# Patient Record
Sex: Female | Born: 1965 | Race: White | Hispanic: No | Smoking: Current every day smoker
Health system: Southern US, Community
[De-identification: ages and names within clinical notes are randomized; demographics above are authoritative.]

## PROBLEM LIST (undated history)

## (undated) DIAGNOSIS — I10 Essential (primary) hypertension: Secondary | ICD-10-CM

## (undated) DIAGNOSIS — J45909 Unspecified asthma, uncomplicated: Secondary | ICD-10-CM

## (undated) DIAGNOSIS — G629 Polyneuropathy, unspecified: Secondary | ICD-10-CM

## (undated) DIAGNOSIS — J189 Pneumonia, unspecified organism: Secondary | ICD-10-CM

## (undated) DIAGNOSIS — K219 Gastro-esophageal reflux disease without esophagitis: Secondary | ICD-10-CM

## (undated) DIAGNOSIS — F419 Anxiety disorder, unspecified: Secondary | ICD-10-CM

## (undated) DIAGNOSIS — J449 Chronic obstructive pulmonary disease, unspecified: Secondary | ICD-10-CM

## (undated) HISTORY — DX: Essential (primary) hypertension: I10

## (undated) HISTORY — DX: Chronic obstructive pulmonary disease, unspecified: J44.9

## (undated) HISTORY — DX: Pneumonia, unspecified organism: J18.9

## (undated) HISTORY — PX: ENDOMETRIAL ABLATION: SHX621

## (undated) HISTORY — PX: TUBAL LIGATION: SHX77

---

## 1998-11-22 ENCOUNTER — Encounter: Admission: RE | Admit: 1998-11-22 | Discharge: 1999-02-20 | Payer: Self-pay | Admitting: Unknown Physician Specialty

## 1999-05-26 ENCOUNTER — Ambulatory Visit (HOSPITAL_COMMUNITY): Admission: RE | Admit: 1999-05-26 | Discharge: 1999-05-26 | Payer: Self-pay | Admitting: Neurosurgery

## 1999-05-26 ENCOUNTER — Encounter: Payer: Self-pay | Admitting: Neurosurgery

## 1999-06-13 ENCOUNTER — Encounter: Admission: RE | Admit: 1999-06-13 | Discharge: 1999-09-11 | Payer: Self-pay | Admitting: Neurosurgery

## 1999-06-17 ENCOUNTER — Encounter: Admission: RE | Admit: 1999-06-17 | Discharge: 1999-07-25 | Payer: Self-pay | Admitting: Neurosurgery

## 1999-06-24 ENCOUNTER — Encounter: Payer: Self-pay | Admitting: Neurosurgery

## 1999-06-24 ENCOUNTER — Ambulatory Visit (HOSPITAL_COMMUNITY): Admission: RE | Admit: 1999-06-24 | Discharge: 1999-06-24 | Payer: Self-pay | Admitting: Neurosurgery

## 1999-07-08 ENCOUNTER — Encounter: Payer: Self-pay | Admitting: Neurosurgery

## 1999-07-08 ENCOUNTER — Ambulatory Visit (HOSPITAL_COMMUNITY): Admission: RE | Admit: 1999-07-08 | Discharge: 1999-07-08 | Payer: Self-pay | Admitting: Neurosurgery

## 1999-07-22 ENCOUNTER — Ambulatory Visit (HOSPITAL_COMMUNITY): Admission: RE | Admit: 1999-07-22 | Discharge: 1999-07-22 | Payer: Self-pay | Admitting: Neurosurgery

## 1999-07-22 ENCOUNTER — Encounter: Payer: Self-pay | Admitting: Neurosurgery

## 1999-08-25 HISTORY — PX: BACK SURGERY: SHX140

## 1999-10-31 ENCOUNTER — Encounter: Payer: Self-pay | Admitting: Neurosurgery

## 1999-11-04 ENCOUNTER — Encounter: Payer: Self-pay | Admitting: Neurosurgery

## 1999-11-04 ENCOUNTER — Inpatient Hospital Stay (HOSPITAL_COMMUNITY): Admission: RE | Admit: 1999-11-04 | Discharge: 1999-11-08 | Payer: Self-pay | Admitting: Neurosurgery

## 1999-11-19 ENCOUNTER — Encounter: Payer: Self-pay | Admitting: Neurosurgery

## 1999-11-19 ENCOUNTER — Encounter: Admission: RE | Admit: 1999-11-19 | Discharge: 1999-11-19 | Payer: Self-pay | Admitting: Neurosurgery

## 1999-12-05 ENCOUNTER — Encounter: Payer: Self-pay | Admitting: Emergency Medicine

## 1999-12-05 ENCOUNTER — Emergency Department (HOSPITAL_COMMUNITY): Admission: EM | Admit: 1999-12-05 | Discharge: 1999-12-05 | Payer: Self-pay | Admitting: Emergency Medicine

## 2000-02-02 ENCOUNTER — Encounter: Admission: RE | Admit: 2000-02-02 | Discharge: 2000-02-02 | Payer: Self-pay | Admitting: Neurosurgery

## 2000-02-02 ENCOUNTER — Encounter: Payer: Self-pay | Admitting: Neurosurgery

## 2000-02-07 ENCOUNTER — Emergency Department (HOSPITAL_COMMUNITY): Admission: EM | Admit: 2000-02-07 | Discharge: 2000-02-07 | Payer: Self-pay | Admitting: Emergency Medicine

## 2000-09-09 ENCOUNTER — Encounter: Admission: RE | Admit: 2000-09-09 | Discharge: 2000-09-09 | Payer: Self-pay | Admitting: Neurosurgery

## 2000-09-09 ENCOUNTER — Encounter: Payer: Self-pay | Admitting: Neurosurgery

## 2000-09-28 ENCOUNTER — Encounter: Payer: Self-pay | Admitting: Neurosurgery

## 2000-09-28 ENCOUNTER — Ambulatory Visit (HOSPITAL_COMMUNITY): Admission: RE | Admit: 2000-09-28 | Discharge: 2000-09-28 | Payer: Self-pay | Admitting: Neurosurgery

## 2001-01-07 ENCOUNTER — Encounter: Admission: RE | Admit: 2001-01-07 | Discharge: 2001-04-07 | Payer: Self-pay | Admitting: Anesthesiology

## 2001-01-08 ENCOUNTER — Emergency Department (HOSPITAL_COMMUNITY): Admission: EM | Admit: 2001-01-08 | Discharge: 2001-01-08 | Payer: Self-pay | Admitting: *Deleted

## 2001-01-08 ENCOUNTER — Encounter: Payer: Self-pay | Admitting: Emergency Medicine

## 2001-04-13 ENCOUNTER — Encounter: Admission: RE | Admit: 2001-04-13 | Discharge: 2001-04-23 | Payer: Self-pay | Admitting: Anesthesiology

## 2002-05-01 ENCOUNTER — Encounter: Admission: RE | Admit: 2002-05-01 | Discharge: 2002-05-01 | Payer: Self-pay | Admitting: Orthopedic Surgery

## 2002-06-28 ENCOUNTER — Emergency Department (HOSPITAL_COMMUNITY): Admission: EM | Admit: 2002-06-28 | Discharge: 2002-06-28 | Payer: Self-pay | Admitting: *Deleted

## 2002-06-28 ENCOUNTER — Encounter: Payer: Self-pay | Admitting: *Deleted

## 2002-07-09 ENCOUNTER — Encounter: Payer: Self-pay | Admitting: Anesthesiology

## 2002-07-09 ENCOUNTER — Ambulatory Visit (HOSPITAL_COMMUNITY): Admission: RE | Admit: 2002-07-09 | Discharge: 2002-07-09 | Payer: Self-pay | Admitting: Anesthesiology

## 2002-10-18 ENCOUNTER — Encounter: Admission: RE | Admit: 2002-10-18 | Discharge: 2002-12-06 | Payer: Self-pay | Admitting: Neurosurgery

## 2004-03-04 ENCOUNTER — Emergency Department (HOSPITAL_COMMUNITY): Admission: EM | Admit: 2004-03-04 | Discharge: 2004-03-04 | Payer: Self-pay | Admitting: Emergency Medicine

## 2005-01-21 ENCOUNTER — Emergency Department (HOSPITAL_COMMUNITY): Admission: EM | Admit: 2005-01-21 | Discharge: 2005-01-21 | Payer: Self-pay | Admitting: Emergency Medicine

## 2005-03-05 ENCOUNTER — Ambulatory Visit: Payer: Self-pay | Admitting: Family Medicine

## 2005-04-03 ENCOUNTER — Encounter: Admission: RE | Admit: 2005-04-03 | Discharge: 2005-04-03 | Payer: Self-pay | Admitting: Family Medicine

## 2005-10-05 ENCOUNTER — Ambulatory Visit: Payer: Self-pay | Admitting: Internal Medicine

## 2005-10-08 ENCOUNTER — Ambulatory Visit: Payer: Self-pay | Admitting: Cardiology

## 2005-10-19 ENCOUNTER — Ambulatory Visit: Payer: Self-pay | Admitting: Internal Medicine

## 2006-01-04 ENCOUNTER — Ambulatory Visit: Payer: Self-pay | Admitting: Internal Medicine

## 2006-02-02 ENCOUNTER — Ambulatory Visit: Payer: Self-pay | Admitting: Internal Medicine

## 2006-12-13 ENCOUNTER — Ambulatory Visit: Payer: Self-pay | Admitting: Family Medicine

## 2007-12-27 ENCOUNTER — Ambulatory Visit: Payer: Self-pay | Admitting: Cardiovascular Disease

## 2008-01-06 ENCOUNTER — Ambulatory Visit: Payer: Self-pay

## 2008-01-06 ENCOUNTER — Encounter: Payer: Self-pay | Admitting: Cardiovascular Disease

## 2008-02-03 DIAGNOSIS — J309 Allergic rhinitis, unspecified: Secondary | ICD-10-CM | POA: Insufficient documentation

## 2008-02-03 DIAGNOSIS — J4489 Other specified chronic obstructive pulmonary disease: Secondary | ICD-10-CM | POA: Insufficient documentation

## 2008-02-03 DIAGNOSIS — J449 Chronic obstructive pulmonary disease, unspecified: Secondary | ICD-10-CM

## 2008-02-06 ENCOUNTER — Ambulatory Visit: Payer: Self-pay | Admitting: Internal Medicine

## 2008-03-08 ENCOUNTER — Ambulatory Visit: Payer: Self-pay | Admitting: Internal Medicine

## 2008-04-03 ENCOUNTER — Encounter: Admission: RE | Admit: 2008-04-03 | Discharge: 2008-04-03 | Payer: Self-pay | Admitting: Anesthesiology

## 2008-11-17 DIAGNOSIS — R079 Chest pain, unspecified: Secondary | ICD-10-CM | POA: Insufficient documentation

## 2008-11-17 DIAGNOSIS — E039 Hypothyroidism, unspecified: Secondary | ICD-10-CM | POA: Insufficient documentation

## 2008-11-17 DIAGNOSIS — R0602 Shortness of breath: Secondary | ICD-10-CM

## 2008-11-17 DIAGNOSIS — M549 Dorsalgia, unspecified: Secondary | ICD-10-CM | POA: Insufficient documentation

## 2009-05-30 ENCOUNTER — Encounter (HOSPITAL_COMMUNITY): Payer: Self-pay | Admitting: Obstetrics and Gynecology

## 2009-05-30 ENCOUNTER — Ambulatory Visit (HOSPITAL_COMMUNITY): Admission: RE | Admit: 2009-05-30 | Discharge: 2009-05-30 | Payer: Self-pay | Admitting: Obstetrics and Gynecology

## 2009-08-22 ENCOUNTER — Telehealth (INDEPENDENT_AMBULATORY_CARE_PROVIDER_SITE_OTHER): Payer: Self-pay | Admitting: *Deleted

## 2010-02-26 ENCOUNTER — Encounter: Admission: RE | Admit: 2010-02-26 | Discharge: 2010-02-26 | Payer: Self-pay | Admitting: Anesthesiology

## 2010-09-14 ENCOUNTER — Encounter: Payer: Self-pay | Admitting: Family Medicine

## 2010-09-15 ENCOUNTER — Encounter: Payer: Self-pay | Admitting: Anesthesiology

## 2010-10-07 ENCOUNTER — Ambulatory Visit: Payer: Self-pay | Admitting: Internal Medicine

## 2010-10-08 ENCOUNTER — Telehealth: Payer: Self-pay | Admitting: Internal Medicine

## 2010-10-15 NOTE — Progress Notes (Signed)
Summary: nos appt  Phone Note Call from Patient   Caller: [email or web address]  Call For: wert Summary of Call: LMTCB x2 to rsc nos from 12/14. Initial call taken by: Darletta Moll,  October 08, 2010 3:20 PM

## 2010-11-27 LAB — TYPE AND SCREEN
ABO/RH(D): A POS
Antibody Screen: NEGATIVE

## 2010-11-27 LAB — CBC
HCT: 39 % (ref 36.0–46.0)
Hemoglobin: 13.2 g/dL (ref 12.0–15.0)
MCV: 92.2 fL (ref 78.0–100.0)
Platelets: 288 10*3/uL (ref 150–400)
RBC: 4.23 MIL/uL (ref 3.87–5.11)
WBC: 7.8 10*3/uL (ref 4.0–10.5)

## 2010-11-27 LAB — ABO/RH: ABO/RH(D): A POS

## 2011-01-06 NOTE — Assessment & Plan Note (Signed)
[facility]                            CARDIOLOGY OFFICE NOTE   KATHLEE, BARNHARDT                      MRN:          [id number]  DATE:12/27/2007                            DOB:          1966-06-14    PRIMARY CARE PHYSICIAN:  Delaney Meigs, M.D.   HISTORY:  Ms. Natasha Massey is a 45 year old patient referred for chest pain  and dyspnea.  The patient is unfortunately a smoker, half a pack for  over 25 years.   The patient has had chest pain over the last 6 months.  It has been  worse in the last month.  It is atypical.  It is not necessarily  exertional.  She does describe a squeezing-type pain in her chest, but  also a sharp pain.  It can radiate to her shoulders and down her left  arm.  It is indicated as atypical and not always being exertional.  There is no pleuritic component.  She does have an occasional cough.  She seems to indicate that she gets frequent pneumonias.  I suspect  there is some immunocompromise from her smoking.  The patient has not had a stress test.  Her dyspnea is also somewhat  chronic likely related to her smoking.  She has not had formal PFTs   When the patient gets her pain, there is not anything in particular she  does to make it go away.  She has chronic back problems and is on fairly  strong narcotic medication called Kadian for pain. This does not seem to  interact with her chest pain.   REVIEW OF SYSTEMS:  Otherwise remarkable for question of frequent  pneumonias.  She has had stomach problems with constipation.  Again,  likely related to the narcotics and question of blood in her stool.  There is also a history of thyroid disease with recent elevated TSH on  replacement.   PAST MEDICAL HISTORY:  1. Includes L5 fusion for degenerative disk disease in 2000.  2. Recently diagnosed hypothyroidism.  3. Mild menstrual dysfunction.  4. Smoking with clinical COPD.  5. Constipation.   SOCIAL HISTORY:  The patient is  disabled.  She is divorced.  She has 2  daughters that live near her and her mother helps out.  She watches her  43-year-old granddaughter daily.  She is able to walk despite her back  problems.   ALLERGIES:  1. PENICILLIN.  2. ASPIRIN.  Aspirin causes a significant rash.   CURRENT MEDICATIONS:  1. Kadian 200 a for pain.  2. Levothyroxine 50 mcs a day.   FAMILY HISTORY:  Remarkable for her mother being alive at age 16.  She  does not report anything for her father.   PHYSICAL EXAMINATION:  GENERAL:  Remarkable for a middle-aged white  female in no distress.  VITAL SIGNS:  Blood pressure is 115/75, pulse 82 and regular, weight  173, afebrile.  HEENT:  Unremarkable.  NECK:  Carotids are normal without bruit.  No lymphadenopathy,  thyromegaly JVP elevation.  LUNGS:  Clear with good diaphragmatic motion.  No wheezing.  CARDIAC:  S1-S2  with normal heart sounds.  PMI normal.  ABDOMEN:  Benign.  Bowel sounds are positive.  No AAA.  No tenderness,  no bruit, no hepatosplenomegaly, no hepatojugular reflux.  EXTREMITIES:  Distal pulses are intact, no edema.  No muscular weakness.  NEURO:  Nonfocal.  SKIN:  Warm and dry.   DIAGNOSTICS:  EKG shows low-voltage sinus rhythm, nonspecific ST/T wave  changes.   IMPRESSION:  1. Chest pain atypical in a smoker, abnormal electrocardiogram.      Follow up stress Myoview.  2. Abnormal electrocardiogram with low-voltage.  Check 2-D      echocardiogram to rule out pericardial effusion.  3. Dyspnea likely related to smoking.  Primary care doctors can      consider whether they want to follow up with PFTs.  The patient      needs to stop smoking.  I will leave it up to her primary care      doctors to add Wellbutrin or Chantix.  4. Hypothyroidism.  TSH was only 8 on low-dose levothyroxine, probably      follow up TSH in 6 months.  5. Chronic back pain on to Kadian.  6. Consider addition of stool softeners and laxative in regards to her       constipation.   Further recommendations will be based on the results of her echo and  Myoview.     Noralyn Pick. Eden Emms, MD, [facility]  Electronically Signed    PCN/MedQ  DD: 12/27/2007  DT: 12/27/2007  Job #: [id number]   cc:   Helene Kelp, PA-C

## 2011-01-09 NOTE — Procedures (Signed)
[facility]  Patient:    Natasha Massey, Natasha Massey Visit Number: [id number] MRN: [id number]          Service Type: PMG Location: [facility] Attending Physician:  Thyra Breed Proc. Date: 04/13/01 Adm. Date:  01/07/2001 Disc. Date: 04/07/2001   CC:         [name]. [name], M.D.   Procedure Report  PROCEDURE:  Lumbar epidural steroid injection.  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  INTERVAL HISTORY:  The patients noted at least a 70% reduction in her discomfort after her first lumbar epidural steroid injection. She continues on the same medications as previously which include Duragesic 75 mcg one every three days and Elavil 25 mg two in the evening.  PHYSICAL EXAMINATION:  The patient demonstrates a blood pressure of 87/56, heart rate 105, respiratory rate 16, O2 saturations 99%, pain level is 6/10. Her neuro exam is grossly unchanged from her last visit. She showed good healing from a previous injection site.  DESCRIPTION OF PROCEDURE:  After informed consent was obtained, the patient was placed in the sitting position and monitored. The patients back was prepped with Betadine x 3. A skin wheal was raised at the L3-4 interspace with 1 percent lidocaine using a 25 gauge needle. A 20 gauge Tuohy needle was introduced to the lumbar epidural space to loss of resistance to preservative free normal saline. The depth was 4 cm. A mixture of 80 mg of Medrol with 6 ml of preservative free normal saline was gently injected.  The needle was flushed and removed intact.  CONDITION POST PROCEDURE:  Stable.  DISCHARGE INSTRUCTIONS:  Resume previous diet. Limitations in activities per instruction sheet. Continue on current medications. Follow-up with me in one week for repeat epidural. Attending Physician:  Thyra Breed DD:  04/13/01 TD:  04/13/01 Job: [id number]

## 2011-01-09 NOTE — Procedures (Signed)
[facility]  Patient:    Natasha Massey, Natasha Massey                      MRN: [id number] Proc. Date: 01/19/01 Adm. Date:  [id number] Attending:  Thyra Breed CC:         Colon Flattery, D.O.  [name]. [name], M.D.   Procedure Report  PROCEDURE:  Trigger point injection of her left piriformis.  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  INTERVAL HISTORY:  The patient has noted some improvements with regard to her overall pain and sleeping quality on the Duragesic patch and the Elavil.  She is tolerating these well.  She did feel as though the injection was temporarily helpful and would like another one today.  PHYSICAL EXAMINATION: Blood pressure 89/45.  Heart rate is 83.  Respiratory rate 18.  O2 saturations 98%.  Pain level is 8.5/10.  She exhibits tenderness over her left piriformis muscle, and the area is marked.  Her neuro exam is otherwise unchanged.  DESCRIPTION OF PROCEDURE:  After informed consent was obtained, I prepped out the area of involvement over her left piriformis muscle.  I injected the subcutaneous tissues with a 27 gauge needle to anesthetize the skin with 1% lidocaine.  A 25 gauge spinal needle was introduced down to approximately the piriformis muscle by elicitation of paresthesias and retracting the needle back approximately .5 cm.  Aspiration was negative for blood and CSF.  I injected 3 cc of local anesthetic which consisted of 1% lidocaine mixed with 0.5% levobupivacaine in a 1:1 ratio.  The needle was removed intact.  Condition postprocedure - stable.  DISCHARGE INSTRUCTIONS: 1. Resume previous diet. 2. Limitations and activities per instruction sheet and as outlined by my    assistant today. 3. Follow up with me next week to consider repeat injections. 4. She was planning to go ahead and see Dr. Dewaine Conger for her lymphadenopathy    next week.DD:  01/19/01 TD:  01/19/01 Job:  [id number]

## 2011-01-09 NOTE — Op Note (Signed)
[place]  Patient:    Natasha Massey, Natasha Massey                      MRN: [id number] Proc. Date: 11/04/99 Adm. Date:  [id number] Attending:  Danella Penton                           Operative Report  PREOPERATIVE DIAGNOSIS:  Degenerative disk disease L5-S1 with bilateral foraminal stenosis and radiculopathy.  Chronic back pain.  POSTOPERATIVE DIAGNOSIS:  Degenerative disk disease L5-S1 with bilateral foraminal stenosis and radiculopathy.  Chronic back pain.  OPERATION PERFORMED:  Bilateral L5-S1 diskectomy, facetectomy, foraminotomies.  Insertion of Ray cages 14 x 21.  Autologous bone graft and posterolateral fusion transverse process to sacral with the same autologous bone graft.  Midas Rex.  SURGEON:  [name]. [name], M.D.  ASSISTANT:  Payton Doughty, M.D.  ANESTHESIA:  CLINICAL HISTORY:  Ms. Ruppert is a 45 year old female who had been complaining f back pain with radiation to both legs associated with weakness and dorsiflexion. The patient has failed conservative treatment including epidural injection and medication.  MRI showed a bad case of degenerative disk disease between 5-1. The patient is a heavy smoker.  Because of no improvement, the patient will now have her surgery.  The patient knew of the risks such as poor uptake of bone graft because of the smoking history, infection, CSF leak, worsening of the pain, paralysis and need for further surgery.  DESCRIPTION OF PROCEDURE:  The patient was taken to the operating room and was positioned in a prone manner.  We knew by x-ray that there was no abnormality between flexion and extension of the lumbar spine.  Then the area was prepped with Betadine.  A midline incision from L5 to S1 was made.  Muscle and fascia were retracted laterally all the way behind the facet until we were able to feel the  transverse process of L5 as well as the ala of the sacrum.  Then we  proceeded with the Leksell and Midas Rex and removal of lamina of L5 bilaterally and part of the upper part of S1.  A thick yellow ligament was also excised laterally.  We identified the L5-S1 space by x-ray.  Indeed, there was degenerative disk disease and the disk was soft.  There was some spondylosis.  We did total diskectomy. Having done this, we did decompression of the L5 and S1 nerve root all the way lateral.  Then with the Leksell as well as the Midas Rex we removed the facet of L5-S1.  Then after we did the total gross diskectomy, we inserted the __________ to drill the end plate of X1-G6.  Having done this, we took an x-ray which showed good position of the equipment.  From then on we measured the area and we were ble to insert two 14 x 21 Ray cages.  During dissection, protection of the L5, S1 as well as the thecal sac was done.  Having done this, we did the x-ray which showed good position of both Ray cages.  Then using the bone from the facet of the lamina, we mixed it with Grafton.  The Ray cages were filled up with this mix of Grafton and autologous bone graft.  The plastic caps were used.  Then we went laterally and with the Midas Rex, we drilled the transverse process of L5 and the ala of the  sacrum.  Then, having the same materials, we put the mixture laterally into that area.  In the end, the area was irrigated.  There was no evidence of any CSF leak. Investigation showed good position of the Ray cages as well as plenty of room for the thecal sac of the L5 and S1 nerve root.  Preservation of the spinous process as well as the interspinous ligament was done.  Having done this, a piece of fat was left in the epidural space.  The area was irrigated.  Fentanyl was left in he dural space and the wound was closed with Vicryl and Steri-Strips. DD:  11/04/99 TD:  11/04/99 Job: [id number]

## 2011-01-09 NOTE — Procedures (Signed)
[facility]  Patient:    Natasha Massey, Natasha Massey                      MRN: [id number] Proc. Date: 03/23/01 Adm. Date:  [id number] Attending:  Thyra Breed CC:         Colon Flattery, D.O.  [name]. [name], M.D.   Procedure Report  PROCEDURE:  Piriformis trigger point injection.  DIAGNOSIS:  Piriformis syndrome with history of sciatica and underlying lumbar degenerative disk disease and spondylosis.  INTERVAL HISTORY:  The patient noted that she gets about 5-10 days of marked improvement after her trigger point injections but between these, she is having flare ups of discomfort.  She has tried to do stretching exercises at the pool, and I reviewed with her these exercises to try and increase the length that the trigger point injections will help.  She is tolerating the Duragesic well but is not sleeping well on her current dose of Elavil 25 mg in the evening.  PHYSICAL EXAMINATION: Blood pressure 116/51.  Heart rate is 75.  Respiratory rate 18.  O2 saturations 98%.  Pain level is 8/10.  Deep tendon reflexes are symmetric at the knees and ankles.  She has pain on extension of her right leg completely which is potentiated by internal rotation at the hip.  Sensory and motor exam are unchanged.  DESCRIPTION OF PROCEDURE:  After informed consent was obtained, the patient was placed in the right lateral decubitus position with the right leg extended and her left flexed at the hip and knee.  A line was drawn from the greater trochanter to the posterior superior iliac spine, and at the mid point of this line, a line was drawn perpendicularly to intersect the line from the great trochanter to the sacral hiatus.  Pressure over this area elicited discomfort. The area was marked and prepped with Betadine x 3.  A skin wheal was raised with a 27 gauge needle using 1% lidocaine.  A 25 gauge spinal needle was introduced down to paresthesias in the sciatic nerve  distribution and retracted back approximately 3-5 mm.  Aspiration was negative for blood, and I injected 3 cc of the local anesthetic mixture.  The needle was removed intact.  The local anesthetic mixture consisted of 4 cc of 1% lidocaine mixed with 4 cc of 0.5% levobupivacaine with 20 mg of Medrol.  POSTPROCEDURE CONDITION:  The patient noted good pain relief.  DISPOSITION: 1. Continue on current medications. 2. Limitations and activities per instruction sheet, as outlined by my    assistant today. 3. Continue on Duragesic patch 75 mcg 1 every 2 days #15 with no refill and    Elavil 25 mg 2 p.o. q.p.m. #60 with 3 refills. 4. Follow up with me in two weeks to consider repeat injection of the    piriformis muscle. 5. I reviewed stretching exercises and encouraged her to use a tennis ball and    try and massage her buttock region. DD:  03/23/01 TD:  03/23/01 Job: [id number]

## 2011-01-09 NOTE — Discharge Summary (Signed)
[place]  Patient:    Natasha Massey, Natasha Massey                      MRN: [id number] Adm. Date:  [id number] Disc. Date: [id number] Attending:  Danella Penton                           Discharge Summary  For full details of this admission, please refer to the typed history and physical.  HISTORY OF PRESENT ILLNESS:  The patient is a 45 year old white female who has een complaining of back pain with radiation into both legs associated with weakness and dorsiflexion.  The patient has failed medical management including epidural steroid injections.  MRI demonstrated degenerative disease at L5-S1.  The patient failed medical management and, therefore, weighed the risks, benefits, and alternatives to surgery and decided to proceed with a diskectomy and fusion.  For past medical history, past surgical history, medications prior to admission, drug allergies, family medical history, social history, admission physical examination, imaging studies, assessment plan, etc., please refer to the typed history and physical.  HOSPITAL COURSE:  Dr. [name] admitted the patient on November 04, 1999, with the diagnosis of L5-S1 degenerative disk disease, spinal stenosis, lumbago, lumbar radiculopathy.  On the day of admission he performed a bilateral L5-S1 diskectomy, facetectomy, foraminotomies with posterior lumbar interbody fusion and insertion of titanium interbody cages and local morcellized autograft bone and posterolateral transverse process fusion using autologous bone.  The surgery went well without  complications (for full details of this operation, please refer to the typed operative note).  POSTOPERATIVE COURSE:  The patients postoperative course was as follows:  She remained afebrile, vital signs were stable.  She did have some weakness in her right extensor hallucis longus/dorsiflexors, approximately 3-4/5.  This continued. The patient was seen by  physical therapy and mobilized.  By November 08, 1999, she was afebrile, vital signs were stable.  She was eating well, ambulating well.  Her wound was healing well without signs of infection.  She continued to have right  EHL/dorsiflexor weakness.  She requested discharge home.  She was, therefore, discharged home on November 08, 1999.  DISCHARGE MEDICATIONS: 1. Valium 5 mg #50 1 p.o. q.6h. p.r.n. for muscle spasms, no refills. 2. Percocet 5 #60 1-2 p.o. q.4h. p.r.n. for pain, limit eight per day, no refills.  DISCHARGE INSTRUCTIONS:  The patient was given written discharge instructions.  FOLLOW-UP:  The patient was instructed to follow up with Dr. [name].  FINAL DIAGNOSIS:  L5-S1 degenerative disk disease, herniated nucleus pulposus, spinal stenosis, lumbago, lumbar radiculopathy.  PROCEDURES:  L5-S1 bilateral diskectomy, facetectomy, foraminotomies, insertion of Ray cages, autologous bone graft, posterolateral fusion transverse process to sacrum. DD:  11/08/99 TD:  11/10/99 Job: [id number]

## 2011-01-09 NOTE — H&P (Signed)
[place]  Patient:    Natasha Massey, Natasha Massey                      MRN: [id number] Adm. Date:  [id number] Attending:  Danella Penton                         History and Physical  HISTORY OF PRESENT ILLNESS:  Natasha Massey is a lady who is 45 years old, who was een by me initially on May 08, 1999.  At that time, she came complaining of back pain for almost two years.  According to her, the pain had started in the area f the lumbar spine, down to both hips anteriorly, associated with a tingling sensation most in the lateral aspect of both thighs.  The patient came telling e that she was feeling worse.  Patient was found to have a cyst of the ovary and he had been taken care of by her gynecologist.  Because of the back pain, she was ent to [place] for evaluation.  At the beginning, we did an MRI which showed that she had a cystic lesion at the level of the S1 nerve but there was no displacement of the  thecal sac.  What we found was a bad case of degenerative disc disease with foraminal stenosis at the level of 5-1.  We continued with conservative treatment including an L5-S1 epidural injection and the patient did not improve.  She continued to have more pain down to both legs.  Because of that, she wanted to o ahead with surgery.  PAST MEDICAL HISTORY:  Tubal ligation.  ALLERGIES:  She is allergic to ASPIRIN.  SOCIAL HISTORY:  She smokes a half a pack a day.  She does not drink.  FAMILY HISTORY:  Mother is 90 with emphysema and melanoma.  There is also a history of diabetes, cancer and heart disease in her family.  REVIEW OF SYSTEMS:  She complains of some fatigue, night sweats and now she has  abdominal pain, change in the bowel movements and rest pain.  She is nauseated; she had lost 20 pounds.  PHYSICAL EXAMINATION:  HEENT:  Normal.  NECK:  There are no bruits.  She has good flexibility.  LUNGS:  Clear.  HEART:  Heart  sounds normal.  EXTREMITIES:  There were normal extremities and normal pulses.  NEUROLOGIC:  Mental status normal.  Cranial nerves normal.  Strength is 5/5 in he upper and lower extremities.  Sensation seems to be normal.  She has a decrease in flexibility of the lumbar spine and extension on lateralization produces pain down to both hips.  She has some mild weakness on dorsiflexion and plantarflexion of  both feet.  IMAGING STUDIES:  The MRI showed that indeed she has a bad case of degenerative  disc disease at the level of L5-S1 with bilateral foraminal stenosis.  CLINICAL IMPRESSION:  Degenerative disc disease at L5-S1 with foraminal stenosis.  RECOMMENDATION:  The patient is going to be taken to surgery.  We are going to proceed with bilateral L5-S1 diskectomy, facetectomy and interbody fusion using Ray cages and autologous graft.  We are going to proceed also with a posterolateral  fusion using allograft.  Patient knows of the risks with the surgery such as infection, no uptake of the bone graft because of the history of smoking, need or further surgery, paralysis and no improvement whatsoever in her back pain.  Patient declines a second opinion. DD:  11/04/99 TD:  11/04/99 Job: [id number]

## 2011-01-09 NOTE — Procedures (Signed)
[facility]  Patient:    Natasha Massey, Natasha Massey                      MRN: [id number] Proc. Date: 02/11/01 Adm. Date:  [id number] Attending:  Thyra Breed CC:         [name]. [name], M.D.  Colon Flattery, D.O.   Procedure Report  PROCEDURE:  Piriformis injection of the right piriformis with underlying L5 radiculopathy predominantly to the left side.  INTERVAL HISTORY:  The patients developed increasing problems with right buttock and right lower extremity pain with the left side being much less than in the past. She continues on her amitriptyline and Duragesic. She rates her pain at 10/10. She describes her pain in the sense that she would describe the pain she had when she had her myelogram with a burning type discomfort radiating out into both lower extremities but the right is greater than the left today.  PHYSICAL EXAMINATION:  Blood pressure is 97/44, heart rate is 62, respiratory rate 16, O2 saturations 99%, pain level is 10/10. Straight leg raise signs positive bilaterally. She exhibits tenderness which is exquisite over the right piriformis muscle. Deep tendon reflexes were symmetric in the lower extremities.  DESCRIPTION OF PROCEDURE:  After informed consent was obtained, the patient was placed in the left lateral decubitus position with her right leg flexed at the hip and knee. I identified the piriformis muscle by drawing a line from the greater trochanter to the posterior superior iliac spine and a second line from the greater trochanter to the sacral hiatus. A line perpendicular from the mid point of the first line was drawn down to intersect the second line and pressure elicited discomfort.  The area was marked and prepped with Betadine x 3. I anesthetized the skin with a 27 gauge needle using 1% lidocaine and approximated the piriformis muscles by eliciting paresthesias down the sciatic nerve distribution of the right lower extremity.  Aspiration was negative for blood and CSF. I injected 4 cc of 1% lidocaine mixed with 0.5% levobupivacaine in a 1:1 ratio with 10 mg of Medrol. The patient tolerated the procedure well.  CONDITION POST PROCEDURE:  Stable.  DISCHARGE INSTRUCTIONS: 1. Resume previous diet. 2. Limitations in activities per instruction sheet. 3. Continue on current medications. 4. Followup with me as previously arranged. DD:  02/11/01 TD:  02/11/01 Job: [id number]

## 2011-01-09 NOTE — Procedures (Signed)
[facility]  Patient:    Natasha Massey, Natasha Massey                      MRN: [id number] Proc. Date: 01/27/01 Adm. Date:  [id number] Attending:  Thyra Breed CC:         Colon Flattery, D.O.  [name]. [name], M.D.   Procedure Report  PROCEDURE:  Piriformis injection of the left piriformis muscle.  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  INTERVAL HISTORY:  The patient noted only minimal improvement after the second injection, but she wants to go for a third today.  She does note that the Duragesic is reducing her pain down to 6/10 but only lasts about two days. She is sleeping much better with the amitriptyline.  PHYSICAL EXAMINATION:  Blood pressure 95/43.  Heart rate is 84.  Respiratory rate 16.  O2 saturations 100%.  Pain level is 8/10/.  She shows good healing from previous injections.  Deep tendon reflexes in the lower extremities were symmetric at the knee and ankle.  Motor is 5/5.  DESCRIPTION OF PROCEDURE:  After informed consent was obtained, the patient was placed in the right lateral decubitus position.  I identified the left piriformis muscle by drawing a line from the greater trochanter to the posterior superior iliac spine and another line from the greater trochanter to the sacral hiatus.  The mid portion of the first line, a perpendicular line was drawn down to intersect the second line, at which point pain was elicited down the leg consistent with a piriformis syndrome.  The area was marked and prepped with Betadine x 3.  I anesthetized the skin with a 27 gauge needle using 1% lidocaine.  A 25 gauge spinal needle was introduced down to approximately the piriformis muscle.  I injected 4 cc of local anesthetic with Medrol after negative aspiration.  The needle was removed intact.  The local anesthetic consisted of 1% lidocaine mixed with 0.5% levobupivacaine in a 1:1 ratio with 40 mg of Medrol  in 8 mL.  Condition postprocedure - stable with marked decrease in pain.  DISCHARGE INSTRUCTIONS: 1. Continue on current diet. 2. Limitations of activities per instruction sheet, as outlined by my    assistant today. 3. Increase Duragesic to 50 mcg every two days, #15. 4. The patient was encouraged to follow up with Dr. Dewaine Conger with regard to her    lymphadenopathy.  She saw him a couple of days ago. 5. Follow up with me in four weeks.  She is to call in two weeks to let us    know whether she is continuing to do well on her current medical regimen. DD:  01/27/01 TD:  01/27/01 Job: [id number]

## 2011-01-09 NOTE — H&P (Signed)
[facility]  Patient:    Natasha Massey, Natasha Massey                      MRN: [id number] Adm. Date:  [id number] Attending:  Thyra Breed CC:         Colon Flattery, D.O.  [name]. [name], M.D.   History and Physical  NEW PATIENT EVALUATION  HISTORY OF PRESENT ILLNESS:  Natasha Massey is a very pleasant 45 year old who was sent to [place] by Dr. [name] for evaluation of a lumbar radiculopathy and treatment.  The patient states that she was in her usual state of health up until about 2000 when she began to develop lower back discomfort radiating out into both hips with a tingling sensation into the lateral thighs.  It had progressed to the point that she was extremely uncomfortable.  She was seen by Dr. [name] and was sent to physical therapy; she did not improve.  She returned and underwent surgical intervention in March 2001.  Shortly after the surgery her lower back discomfort had resolved, but she was left with pain in her hip regions.  She describes the pain as a feeling as though her bones are being crushed by the weight of her body.  Whenever she raises her head up she feels as though it shoots out into the posterior aspect of the left leg. Preoperatively she had a series of epidurals which lasted about a month, but has had no injection therapy postoperatively.  She has been treated with hydrocodone and there has been one attempt to try Neurontin, but she developed side effects to this and it could not be continued.  Hydrocodone mildly improves her discomfort.  She describes the pain as crushing and it is made worse by sitting, lying on her back, standing for more than four to five minutes, and improved by lying on her side or heat, such as a hot bath where she is able to float her body up and take the weight off.  The pain radiates into the left posterior thigh with associated tingling.  There is no weakness or bowel or bladder incontinence.  She has had  nerve conduction studies done by Dr. Kelli Hope in January 2002 which showed a chronic L5 radiculopathy.  An MRI in followup of this showed mild facet joint hypertrophy at L4/L5, but otherwise good fusion at L5/S1.  She was tried on Neurontin following this MRI and did not tolerate this well.  She is here for further evaluation.  She was noted to have a large cyst on one of her ovaries on one of her evaluations.  CURRENT MEDICATIONS:  Hydrocodone.  ALLERGIES:  ASPIRIN and PENICILLIN.  FAMILY HISTORY:  Positive for MS, cancer, diabetes mellitus, coronary artery disease, skin cancer, osteoarthritis, and emphysema.  ACTIVE MEDICAL PROBLEMS:  Asthma.  PAST SURGICAL HISTORY:  Status post bilateral tubal ligation and Ray cage fusion at L5/S1.  SOCIAL HISTORY:  The patient smokes a pack per week.  She does not drink alcohol.  She works very limited hours in a Administrator, [employer].  PHYSICAL EXAMINATION:  VITAL SIGNS:  Blood pressure 108/52, heart rate 65, respiratory rate 16, O2 saturation 99%.  Pain level 9/10.  GENERAL:  The patient is a 45 year old who looks her stated age and is in obvious discomfort.  She was lying down when I came into the room.  HEAD:  Normocephalic, atraumatic.  EYES:  Extraocular movements intact with conjunctivae and sclerae clear.  NOSE:  Patent nares.  OROPHARYNX:  Free of lesions.  NECK:  Demonstrated 1 cm lymphadenopathy along the left posterior cervical chain and what appeared to be a small left supraclavicular node.  There was no thyromegaly.  Carotids were 2+ and symmetric without bruits.  LUNGS:  Clear.  HEART:  Regular rate and rhythm.  BREASTS/GENITALIA/RECTAL:  Exams not performed.  ABDOMEN:  Bowel sounds present without appreciable organomegaly.  I could not appreciate any lymphadenopathy in the inguinal region.  BACK:  Mild scoliosis through the thoracic spine with tenderness over the posterior iliac spines bilaterally, left greater  than right.  She was also tender over the left sciatic notch.  Internal rotation of her left leg elicited pain down the posterior aspect of her leg.  EXTREMITIES:  No clubbing, cyanosis, or edema with radial pulses 2+ and symmetric.  Dorsalis pedis pulses 2+ and symmetric.  She had her right foot bandaged from two toe fractures.  NEUROLOGIC:  The patient is oriented x 4.  Cranial nerves II-XII are grossly intact.  Deep tendon reflexes were symmetric in the upper extremities and lower extremities with downgoing toes.  Motor was 5/5, but limited in assessing her right lower extremity due to the toe fractures.  Sensory was significant for attenuated pinprick over the posterolateral aspect of the left thigh.  Vibratory sense was intact.  Coordination was grossly intact.  PROCEDURE:  The patient was very tender over the left piriformis region, so I went ahead and had her lie in the right lateral decubitus position with her right leg descended and left flexed at hip and knee.  I identified the greater trochanter and the posterior iliac spine.  A line from the greater trochanter to the posterior iliac spine was drawn, and at the midpoint of this line a perpendicular line was drawn down to intersect the line from the greater trochanter to the sacral hiatus.  Pressure over this area elicited pain.  The area was marked and prepped with Betadine x 3.  I anesthetized the skin with a 27-gauge needle using 1% lidocaine and with a 25-gauge needle I approximated the piriformis muscle on the left side.  I injected 3 cc of local anesthetic consisting of 1% lidocaine with mixed with 0.5% bupivacaine in 1:1 ratio. There was approximately 10 mg of Medrol in the solution.  The needle was removed and the patient noted that her pain subsided somewhat, but this did not resolve her pain entirely.  IMPRESSION: 1. L5 radiculopathy status post fusion with some underlying lumbar spondylosis     and tenderness  over the left piriformis. 2. Asthma. 3. Lymphadenopathy of the left side of the neck and supraclavicular region. 4. History of ovarian cyst.  DISPOSITION: 1. The patient is aware that she needs to go ahead and have her    lymphadenopathy assessed. 2. Stop hydrocodone. 3. Duragesic patch 25 mcg 1 applied q.3 days, #10 without refill.  She was    advised of potential side effects of this in detail as well as the need for    a laxative.  She will sign a controlled substance agreement with [place] today. 4. Amitriptyline 10 mg 1 p.o. q.p.m., #30 with 3 refills. 5. Followup with me in one to four weeks.  If she notes that the injection    around the piriformis muscle is helpful, I plan to see her back next week    and repeat the injection.  Otherwise we will see her back in four weeks in  followup in regard to the medical interventions.  I reiterated to the    patient and to her relative present with her that the injection is    temporary and the medications are not designed to take all of her pain    away.  I advised them that we are trying to take the edge off the pain.    She may ultimately benefit from being evaluated by our psychologist. DD:  01/10/01 TD:  01/10/01 Job: [id number]

## 2011-01-09 NOTE — Procedures (Signed)
[facility]  Patient:    Natasha Massey, Natasha Massey Visit Number: [id number] MRN: [id number]          Service Type: PMG Location: [facility] Attending Physician:  Thyra Breed Proc. Date: 04/20/01 Adm. Date:  04/13/2001   CC:         [name]. [name], M.D.  [facility]   Procedure Report  PROCEDURE:  Lumbar epidural steroid injection.  DIAGNOSES:  Lumbar radiculopathy with underlying lumbar spondylosis and degenerative disk disease and a component of sciatica.  INTERVAL HISTORY:  The patient continues to note marked improvement after her first two injections.  PHYSICAL EXAMINATION:  Blood pressure 105/62, heart rate 106, respiratory rate 16, O2 saturations 100%.  Pain level is 3/10, and temperature is 97.7.  She shows good healing from her previous injection site.  DESCRIPTION OF PROCEDURE:  After informed consent was obtained, the patient was placed in a sitting position and monitored.  Her back was prepped with Betadine x 3.  A skin wheal was raised at the L3-4 interspace with 1% lidocaine.  A 20 gauge Tuohy needle was introduced to the lumbar epidural space to loss of resistance to preservative-free normal saline.  The depth was 4 cm.  There was no CSF nor blood.  Medrol 80 mg in 5 mL of preservative-free normal saline was gently injected.  The needle was flushed and removed intact.  POSTPROCEDURE CONDITION:  Stable.  DISCHARGE INSTRUCTIONS: 1. Resume previous diet. 2. Limitations on activities per instruction sheet. 3. Continue on current medications. 4. Prescription written for Duragesic 75 mcg, 1 applied every 2 days #15 with    no refill. 5. Follow up with me in four weeks. Attending Physician:  Thyra Breed DD:  04/20/01 TD:  04/20/01 Job: [id number]

## 2011-01-09 NOTE — Procedures (Signed)
[facility]  Patient:    SHIAH, BERHOW                      MRN: [id number] Proc. Date: 04/06/01 Adm. Date:  [id number] Attending:  Thyra Breed CC:         Colon Flattery, M.D.  [name]. [name], M.D.   Procedure Report  PROCEDURE:  Lumbar epidural steroid injection.  DIAGNOSIS:  Sciatica and lumbar spondylosis with degenerative disk disease.  INTERVAL HISTORY:  The patient has noted that the piriformis injections have not continued to be of much benefit.  She continues on her Elavil and her Duragesic.  She states that she is having a lot of pain into the right groin region as well as to a lesser extent on the left and down the posterior aspect of the legs, her right being worse than her left.  Straight leg raise signs are positive at 30 degrees on the right side.  Deep tendon reflexes were symmetric at the knees and ankles.  Motor is grossly intact.  DESCRIPTION OF PROCEDURE:  After informed consent was obtained, the patient was placed in a sitting position and monitored.  Her back was prepped with Betadine x 3.  A skin wheal was raised at the L3-4 interspace with 1% lidocaine.  A 20 gauge Tuohy needle was introduced to the lumbar epidural space to loss of resistance to preservative-free normal saline.  There was no CSF nor blood.  The depth was 4 cm.  A mixture of 80 mg of Medrol 40 mg with 6 mL of preservative-free normal saline was gently injected.  The needle was flushed with preservative-free normal saline and removed intact.  POSTPROCEDURE CONDITION:  Stable.  DISCHARGE INSTRUCTIONS: 1. Resume previous diet. 2. Limitations on activities per instruction sheet. 3. Continue on current medications. 4. Follow up with me in one week for repeat injection. DD:  04/06/01 TD:  04/06/01 Job: [id number]

## 2011-01-09 NOTE — Procedures (Signed)
[facility]  Patient:    Natasha Massey, Natasha Massey                      MRN: [id number] Proc. Date: 02/23/01 Adm. Date:  [id number] Attending:  Thyra Breed CC:         Colon Flattery, D.O.  [name]. [name], M.D.   Procedure Report  PROCEDURE:  Piriformis trigger point injection.  DIAGNOSIS:  Piriformis syndrome on the left side with underlying lumbar radiculopathy.  INTERVAL HISTORY:  The patient has noted that her left buttock continues to bother her, and to a lesser extent, she is getting right buttock pain.  She feels as though she is getting a lot of muscle spasms in the buttock and thigh regions.  She continues on the Duragesic patch 50 mcg every two days and Elavil 10 mg q.p.m.  PHYSICAL EXAMINATION:  Blood pressure 106/56.  Heart rate is 78.  Respiratory rate 16.  O2 saturations 98%.  Pain level is 8/10.  She exhibits tenderness of the piriformis muscles left greater than right.  Her deep tendon reflexes were symmetric in the lower extremities.  Straight leg raise signs are negative.  DESCRIPTION OF PROCEDURE:  After informed consent was obtained, the patient was placed in the right lateral decubitus position with the right leg extended and her left flexed at the hip and knee.  I identified the greater trochanter and the posterior superior iliac spine.  A line was drawn between these two points, and perpendicular line at the mid point was drawn down to intersect the from the great trochanter to the sacral hiatus.  Pressure over this area elicited pain.  The area was marked and prepped with Betadine x 3.  I anesthetized the skin with a 27 gauge needle using 1% lidocaine.  A 25 gauge spinal needle was introduced to approximate the piriformis muscle.  I injected 3 cc of local anesthetic.  Ten minutes later, the patient noted incomplete relief, so I injected an additional 3 cc.  The local anesthetic consisted of a mixture of 1% lidocaine mixed with  0.5% levobupivacaine in a 1:1 ratio with a total volume of 8 mL with 20 mg of Medrol in a total solution.  After the second injection, the patient noted marked improvement.  IMPRESSION: 1. Lumbar spondylosis with L5 radiculopathy and piriformis syndrome. 2. History of asthma. 3. Lymphadenopathy per her primary care physician.  DISPOSITION: 1. Increase Duragesic to 75 mcg every 2 days #15. 2. Elavil 25 mg 1 p.o. q.d. #30 with 2 refills. 3. Resume previous diet. 4. Limitations of activities per instruction sheet. 5. Follow up with me in four weeks. DD:  02/23/01 TD:  02/23/01 Job: [id number]

## 2014-07-30 ENCOUNTER — Inpatient Hospital Stay (HOSPITAL_COMMUNITY): Payer: Medicare Other

## 2014-07-30 ENCOUNTER — Encounter (HOSPITAL_COMMUNITY): Payer: Self-pay | Admitting: *Deleted

## 2014-07-30 ENCOUNTER — Inpatient Hospital Stay (HOSPITAL_COMMUNITY)
Admission: AD | Admit: 2014-07-30 | Discharge: 2014-07-30 | Disposition: A | Discharge status: Home / Self Care | Payer: Medicare Other | Source: Ambulatory Visit | Attending: Obstetrics and Gynecology | Admitting: Obstetrics and Gynecology

## 2014-07-30 DIAGNOSIS — R1032 Left lower quadrant pain: Secondary | ICD-10-CM | POA: Diagnosis not present

## 2014-07-30 DIAGNOSIS — R109 Unspecified abdominal pain: Secondary | ICD-10-CM

## 2014-07-30 DIAGNOSIS — F17200 Nicotine dependence, unspecified, uncomplicated: Secondary | ICD-10-CM | POA: Diagnosis not present

## 2014-07-30 DIAGNOSIS — N949 Unspecified condition associated with female genital organs and menstrual cycle: Secondary | ICD-10-CM | POA: Diagnosis present

## 2014-07-30 DIAGNOSIS — Z880 Allergy status to penicillin: Secondary | ICD-10-CM | POA: Insufficient documentation

## 2014-07-30 LAB — CBC WITH DIFFERENTIAL/PLATELET
BASOS ABS: 0 10*3/uL (ref 0.0–0.1)
Basophils Relative: 0 % (ref 0–1)
EOS ABS: 0 10*3/uL (ref 0.0–0.7)
EOS PCT: 0 % (ref 0–5)
HCT: 40.1 % (ref 36.0–46.0)
HEMOGLOBIN: 13.5 g/dL (ref 12.0–15.0)
LYMPHS ABS: 1.7 10*3/uL (ref 0.7–4.0)
LYMPHS PCT: 14 % (ref 12–46)
MCH: 30.4 pg (ref 26.0–34.0)
MCHC: 33.7 g/dL (ref 30.0–36.0)
MCV: 90.3 fL (ref 78.0–100.0)
MONOS PCT: 6 % (ref 3–12)
Monocytes Absolute: 0.7 10*3/uL (ref 0.1–1.0)
NEUTROS ABS: 9.2 10*3/uL — AB (ref 1.7–7.7)
NEUTROS PCT: 79 % — AB (ref 43–77)
PLATELETS: 331 10*3/uL (ref 150–400)
RBC: 4.44 MIL/uL (ref 3.87–5.11)
RDW: 13.8 % (ref 11.5–15.5)
WBC: 11.6 10*3/uL — AB (ref 4.0–10.5)

## 2014-07-30 LAB — COMPREHENSIVE METABOLIC PANEL
ALT: 11 U/L (ref 0–35)
AST: 13 U/L (ref 0–37)
Albumin: 3.7 g/dL (ref 3.5–5.2)
Alkaline Phosphatase: 66 U/L (ref 39–117)
Anion gap: 12 (ref 5–15)
BUN: 8 mg/dL (ref 6–23)
CHLORIDE: 105 meq/L (ref 96–112)
CO2: 23 meq/L (ref 19–32)
Calcium: 9.1 mg/dL (ref 8.4–10.5)
Creatinine, Ser: 0.67 mg/dL (ref 0.50–1.10)
GFR calc Af Amer: >90 mL/min (ref >90)
GFR calc non Af Amer: >90 mL/min (ref >90)
GLUCOSE: 109 mg/dL — AB (ref 70–99)
POTASSIUM: 4.2 meq/L (ref 3.7–5.3)
Sodium: 140 mEq/L (ref 137–147)
Total Bilirubin: 0.3 mg/dL (ref 0.3–1.2)
Total Protein: 6.8 g/dL (ref 6.0–8.3)

## 2014-07-30 LAB — URINALYSIS, ROUTINE W REFLEX MICROSCOPIC
BILIRUBIN URINE: NEGATIVE
Glucose, UA: NEGATIVE mg/dL
Hgb urine dipstick: NEGATIVE
Ketones, ur: NEGATIVE mg/dL
Leukocytes, UA: NEGATIVE
Nitrite: NEGATIVE
Protein, ur: NEGATIVE mg/dL
Urobilinogen, UA: 0.2 mg/dL (ref 0.0–1.0)
pH: 5.5 (ref 5.0–8.0)

## 2014-07-30 MED ORDER — HYDROMORPHONE HCL 1 MG/ML IJ SOLN
1.0000 mg | Freq: Once | INTRAMUSCULAR | Status: AC
  Administered 2014-07-30: 1 mg via INTRAMUSCULAR
  Filled 2014-07-30: qty 1

## 2014-07-30 MED ORDER — OXYCODONE-ACETAMINOPHEN 5-325 MG PO TABS: 1.0000 | ORAL_TABLET | Freq: Four times a day (QID) | ORAL | Status: DC | PRN

## 2014-07-30 MED ORDER — LACTATED RINGERS IV SOLN
INTRAVENOUS | Status: DC
  Administered 2014-07-30: 13:00:00 via INTRAVENOUS

## 2014-07-30 MED ORDER — OXYCODONE-ACETAMINOPHEN 5-325 MG PO TABS
2.0000 | ORAL_TABLET | Freq: Once | ORAL | Status: AC
  Administered 2014-07-30: 2 via ORAL
  Filled 2014-07-30: qty 2

## 2014-07-30 MED ORDER — IOHEXOL 300 MG/ML  SOLN
50.0000 mL | INTRAMUSCULAR | Status: AC
  Administered 2014-07-30 (×2): 50 mL via ORAL

## 2014-07-30 MED ORDER — METRONIDAZOLE IN NACL 5-0.79 MG/ML-% IV SOLN
500.0000 mg | Freq: Once | INTRAVENOUS | Status: AC
  Administered 2014-07-30: 500 mg via INTRAVENOUS
  Filled 2014-07-30: qty 100

## 2014-07-30 MED ORDER — IOHEXOL 300 MG/ML  SOLN
100.0000 mL | Freq: Once | INTRAMUSCULAR | Status: AC | PRN
  Administered 2014-07-30: 100 mL via INTRAVENOUS

## 2014-07-30 NOTE — MAU Note (Signed)
Patient sent from MD's office for evaluation of pelvic pain.

## 2014-07-30 NOTE — MAU Provider Note (Signed)
History     CSN: [id number]  Arrival date and time: 07/30/14 1146   First Provider Initiated Contact with Patient 07/30/14 1214      Chief Complaint  Patient presents with  . Pelvic Pain   HPI  Ms. Natasha Massey is a 48 y.o. G2P2 who presents to MAU from the office for LLQ pain since 0300 Saturday. She states pain was originally diffuse abdominal pain, but now more localized to LLQ. She states pain is 10/10 now. She has not taken any pain medication today. She denies vaginal bleeding, discharge, UTI symptoms or hematuria. She states occasional nausea without vomiting. She states 1 episode of loose stools this morning. She denies constipation. She states off and on subjective low grade fever recently. Patient has had endometrial ablation.   OB History    Gravida Para Term Preterm AB TAB SAB Ectopic Multiple Living   2 2        2       History reviewed. No pertinent past medical history.  Past Surgical History  Procedure Laterality Date  . Tubal ligation    . Back surgery      History reviewed. No pertinent family history.  History  Substance Use Topics  . Smoking status: Current Every Day Smoker -- 1.00 packs/day for 35 years  . Smokeless tobacco: Never Used  . Alcohol Use: No    Allergies:  Allergies  Allergen Reactions  . Amoxicillin Hives  . Aspirin Hives  . Penicillins Swelling    Prescriptions prior to admission  Medication Sig Dispense Refill Last Dose  . budesonide-formoterol (SYMBICORT) 160-4.5 MCG/ACT inhaler Inhale 2 puffs into the lungs 2 (two) times daily.   07/29/2014 at Unknown time  . diazepam (VALIUM) 5 MG tablet Take 5 mg by mouth 4 (four) times daily.   07/30/2014 at Unknown time  . gabapentin (NEURONTIN) 100 MG capsule Take 200 mg by mouth 2 (two) times daily.   Past Week at Unknown time    Review of Systems  Constitutional: Negative for fever and malaise/fatigue.  Gastrointestinal: Positive for nausea, abdominal pain and diarrhea. Negative  for vomiting and constipation.  Genitourinary: Negative for dysuria, urgency, frequency, hematuria and flank pain.   Physical Exam   Blood pressure 126/75, pulse 68, temperature 97.9 F (36.6 C), temperature source Oral, resp. rate 18, height 5\' 6"  (1.676 m), weight 153 lb 4 oz (69.514 kg).  Physical Exam  Constitutional: She is oriented to person, place, and time. She appears well-developed and well-nourished. No distress.  HENT:  Head: Normocephalic.  Cardiovascular: Normal rate, regular rhythm and normal heart sounds.   Respiratory: Effort normal and breath sounds normal. No respiratory distress.  GI: Soft. Bowel sounds are normal. She exhibits no distension and no mass. There is tenderness (mild diffuse tenderness and moderate LLQ tenderness to palpation). There is guarding. There is no rebound.  Neurological: She is alert and oriented to person, place, and time.  Skin: Skin is warm and dry. No erythema.  Psychiatric: She has a normal mood and affect.   Results for orders placed or performed during the hospital encounter of 07/30/14 (from the past 24 hour(s))  Urinalysis, Routine w reflex microscopic     Status: Abnormal   Collection Time: 07/30/14 11:53 AM  Result Value Ref Range   Color, Urine YELLOW YELLOW   APPearance CLEAR CLEAR   Specific Gravity, Urine >1.030 (H) 1.005 - 1.030   pH 5.5 5.0 - 8.0   Glucose, UA NEGATIVE NEGATIVE mg/dL  Hgb urine dipstick NEGATIVE NEGATIVE   Bilirubin Urine NEGATIVE NEGATIVE   Ketones, ur NEGATIVE NEGATIVE mg/dL   Protein, ur NEGATIVE NEGATIVE mg/dL   Urobilinogen, UA 0.2 0.0 - 1.0 mg/dL   Nitrite NEGATIVE NEGATIVE   Leukocytes, UA NEGATIVE NEGATIVE  CBC with Differential     Status: Abnormal   Collection Time: 07/30/14 12:21 PM  Result Value Ref Range   WBC 11.6 (H) 4.0 - 10.5 K/uL   RBC 4.44 3.87 - 5.11 MIL/uL   Hemoglobin 13.5 12.0 - 15.0 g/dL   HCT 40.1 36.0 - 46.0 %   MCV 90.3 78.0 - 100.0 fL   MCH 30.4 26.0 - 34.0 pg   MCHC  33.7 30.0 - 36.0 g/dL   RDW 13.8 11.5 - 15.5 %   Platelets 331 150 - 400 K/uL   Neutrophils Relative % 79 (H) 43 - 77 %   Neutro Abs 9.2 (H) 1.7 - 7.7 K/uL   Lymphocytes Relative 14 12 - 46 %   Lymphs Abs 1.7 0.7 - 4.0 K/uL   Monocytes Relative 6 3 - 12 %   Monocytes Absolute 0.7 0.1 - 1.0 K/uL   Eosinophils Relative 0 0 - 5 %   Eosinophils Absolute 0.0 0.0 - 0.7 K/uL   Basophils Relative 0 0 - 1 %   Basophils Absolute 0.0 0.0 - 0.1 K/uL  Comprehensive metabolic panel     Status: Abnormal   Collection Time: 07/30/14 12:21 PM  Result Value Ref Range   Sodium 140 137 - 147 mEq/L   Potassium 4.2 3.7 - 5.3 mEq/L   Chloride 105 96 - 112 mEq/L   CO2 23 19 - 32 mEq/L   Glucose, Bld 109 (H) 70 - 99 mg/dL   BUN 8 6 - 23 mg/dL   Creatinine, Ser 0.67 0.50 - 1.10 mg/dL   Calcium 9.1 8.4 - 10.5 mg/dL   Total Protein 6.8 6.0 - 8.3 g/dL   Albumin 3.7 3.5 - 5.2 g/dL   AST 13 0 - 37 U/L   ALT 11 0 - 35 U/L   Alkaline Phosphatase 66 39 - 117 U/L   Total Bilirubin 0.3 0.3 - 1.2 mg/dL   GFR calc non Af Amer >90 >90 mL/min   GFR calc Af Amer >90 >90 mL/min   Anion gap 12 5 - 15   Ct Abdomen Pelvis W Wo Contrast  07/30/2014   CLINICAL DATA:  Initial encounter for Mid abdominal pain radiating down to left lower quadrant for 3 days. Nausea with low-grade fever and diarrhea.  EXAM: CT ABDOMEN AND PELVIS WITHOUT AND WITH CONTRAST  TECHNIQUE: Multidetector CT imaging of the abdomen and pelvis was performed following the standard protocol before and following the bolus administration of intravenous contrast.  CONTRAST:  123mL OMNIPAQUE IOHEXOL 300 MG/ML  SOLN  COMPARISON:  08/24/2011.  FINDINGS: Lower chest: Incompletely visualized atelectasis or pneumonia seen at the base of the lingula. There is some subsegmental atelectasis in the posterior left lower lobe.  Hepatobiliary: Stable 14 mm bilobed left hepatic cyst. Other scattered tiny hepatic cysts are stable. There is no evidence for gallstones, gallbladder  wall thickening, or pericholecystic fluid. No intrahepatic or extrahepatic biliary dilation. Slight intrahepatic biliary prominence is unchanged.  Pancreas: No focal mass lesion. No dilatation of the main duct. No intraparenchymal cyst. No peripancreatic edema.  Spleen: No splenomegaly. No focal mass lesion.  Adrenals/Urinary Tract: No adrenal nodule or mass. Stable tiny bilateral low-density renal lesions, likely reflecting cysts. No ureteral dilatation.  Bladder is unremarkable.  Stomach/Bowel: Stomach is nondistended. No gastric wall thickening. No evidence of outlet obstruction. Duodenum is normally positioned as is the ligament of Treitz. No small bowel wall thickening. No small bowel dilatation. Terminal ileum is normal. The appendix is located along the right pelvic sidewall and is normal. No gross colonic mass. No colonic wall thickening. No substantial diverticular change.  Vascular/Lymphatic: No abdominal aortic aneurysm. There is no lymphadenopathy in the abdomen or pelvis.  Reproductive: Fibroid change noted in the uterus.  No adnexal mass.  Other: No intraperitoneal free fluid.  Musculoskeletal: Bone windows reveal no worrisome lytic or sclerotic osseous lesions. Status post L5-S1 fusion.  IMPRESSION: No acute findings in the abdomen or pelvis. Specifically, no findings to explain the patient's history of abdominal pain radiating to the left lower quadrant. No colonic diverticulitis or adnexal mass.  Focal area of airspace opacity in the lingula has been incompletely visualized. This could reflect atelectasis or pneumonia.   Electronically Signed   By: Misty Stanley M.D.   On: 07/30/2014 14:41    MAU Course  Procedures None  MDM UA, CBC, CMP and CT of abdomen and pelvis today 1 mg Dilaudid given in MAU - patient denies significant improvement in pain Discussed CT results and patient presentation with Dr. Julien Girt 500 mg IV Flagyl and 2 Percocet PO Patient reports improvement in pain Discussed  patient with Dr. Julien Girt. Fleming for discharge with #30 Percocet. Follow-up in the office in 1 week. Office will call with appointment as well as GI referral appointment soon.   Assessment and Plan  A: LLQ abdominal pain  P: Discharge home Rx for percocet given to patient Patient advised that office will call with an appointment for follow-up and consult with GI Patient encouraged to maintain adequate hydration  Patient may return to MAU as needed or if her condition were to change or worsen   Luvenia Redden, PA-C  07/30/2014, 4:24 PM

## 2014-07-30 NOTE — Discharge Instructions (Signed)
Abdominal Pain, Women °Abdominal (stomach, pelvic, or belly) pain can be caused by many things. It is important to tell your doctor: °· The location of the pain. °· Does it come and go or is it present all the time? °· Are there things that start the pain (eating certain foods, exercise)? °· Are there other symptoms associated with the pain (fever, nausea, vomiting, diarrhea)? °All of this is helpful to know when trying to find the cause of the pain. °CAUSES  °· Stomach: virus or bacteria infection, or ulcer. °· Intestine: appendicitis (inflamed appendix), regional ileitis (Crohn's disease), ulcerative colitis (inflamed colon), irritable bowel syndrome, diverticulitis (inflamed diverticulum of the colon), or cancer of the stomach or intestine. °· Gallbladder disease or stones in the gallbladder. °· Kidney disease, kidney stones, or infection. °· Pancreas infection or cancer. °· Fibromyalgia (pain disorder). °· Diseases of the female organs: °¨ Uterus: fibroid (non-cancerous) tumors or infection. °¨ Fallopian tubes: infection or tubal pregnancy. °¨ Ovary: cysts or tumors. °¨ Pelvic adhesions (scar tissue). °¨ Endometriosis (uterus lining tissue growing in the pelvis and on the pelvic organs). °¨ Pelvic congestion syndrome (female organs filling up with blood just before the menstrual period). °¨ Pain with the menstrual period. °¨ Pain with ovulation (producing an egg). °¨ Pain with an IUD (intrauterine device, birth control) in the uterus. °¨ Cancer of the female organs. °· Functional pain (pain not caused by a disease, may improve without treatment). °· Psychological pain. °· Depression. °DIAGNOSIS  °Your doctor will decide the seriousness of your pain by doing an examination. °· Blood tests. °· X-rays. °· Ultrasound. °· CT scan (computed tomography, special type of X-ray). °· MRI (magnetic resonance imaging). °· Cultures, for infection. °· Barium enema (dye inserted in the large intestine, to better view it with  X-rays). °· Colonoscopy (looking in intestine with a lighted tube). °· Laparoscopy (minor surgery, looking in abdomen with a lighted tube). °· Major abdominal exploratory surgery (looking in abdomen with a large incision). °TREATMENT  °The treatment will depend on the cause of the pain.  °· Many cases can be observed and treated at home. °· Over-the-counter medicines recommended by your caregiver. °· Prescription medicine. °· Antibiotics, for infection. °· Birth control pills, for painful periods or for ovulation pain. °· Hormone treatment, for endometriosis. °· Nerve blocking injections. °· Physical therapy. °· Antidepressants. °· Counseling with a psychologist or psychiatrist. °· Minor or major surgery. °HOME CARE INSTRUCTIONS  °· Do not take laxatives, unless directed by your caregiver. °· Take over-the-counter pain medicine only if ordered by your caregiver. Do not take aspirin because it can cause an upset stomach or bleeding. °· Try a clear liquid diet (broth or water) as ordered by your caregiver. Slowly move to a bland diet, as tolerated, if the pain is related to the stomach or intestine. °· Have a thermometer and take your temperature several times a day, and record it. °· Bed rest and sleep, if it helps the pain. °· Avoid sexual intercourse, if it causes pain. °· Avoid stressful situations. °· Keep your follow-up appointments and tests, as your caregiver orders. °· If the pain does not go away with medicine or surgery, you may try: °¨ Acupuncture. °¨ Relaxation exercises (yoga, meditation). °¨ Group therapy. °¨ Counseling. °SEEK MEDICAL CARE IF:  °· You notice certain foods cause stomach pain. °· Your home care treatment is not helping your pain. °· You need stronger pain medicine. °· You want your IUD removed. °· You feel faint or   lightheaded. °· You develop nausea and vomiting. °· You develop a rash. °· You are having side effects or an allergy to your medicine. °SEEK IMMEDIATE MEDICAL CARE IF:  °· Your  pain does not go away or gets worse. °· You have a fever. °· Your pain is felt only in portions of the abdomen. The right side could possibly be appendicitis. The left lower portion of the abdomen could be colitis or diverticulitis. °· You are passing blood in your stools (bright red or black tarry stools, with or without vomiting). °· You have blood in your urine. °· You develop chills, with or without a fever. °· You pass out. °MAKE SURE YOU:  °· Understand these instructions. °· Will watch your condition. °· Will get help right away if you are not doing well or get worse. °Document Released: 06/07/2007 Document Revised: 12/25/2013 Document Reviewed: 06/27/2009 °[employer]® Patient Information ©2015 [employer]. This information is not intended to replace advice given to you by your health care provider. Make sure you discuss any questions you have with your health care provider. ° °

## 2014-08-09 ENCOUNTER — Ambulatory Visit: Payer: Medicare Other | Admitting: Gastroenterology

## 2014-08-23 ENCOUNTER — Encounter: Payer: Self-pay | Admitting: Gastroenterology

## 2014-08-23 ENCOUNTER — Ambulatory Visit (INDEPENDENT_AMBULATORY_CARE_PROVIDER_SITE_OTHER): Payer: Medicare Other | Admitting: Gastroenterology

## 2014-08-23 VITALS — BP 114/78 | HR 83 | Temp 97.4°F | Ht 66.0 in | Wt 164.6 lb

## 2014-08-23 DIAGNOSIS — K219 Gastro-esophageal reflux disease without esophagitis: Secondary | ICD-10-CM

## 2014-08-23 DIAGNOSIS — R1032 Left lower quadrant pain: Secondary | ICD-10-CM

## 2014-08-23 DIAGNOSIS — R112 Nausea with vomiting, unspecified: Secondary | ICD-10-CM

## 2014-08-23 MED ORDER — OMEPRAZOLE 20 MG PO CPDR: 20.0000 mg | DELAYED_RELEASE_CAPSULE | Freq: Every day | ORAL | Status: DC

## 2014-08-23 NOTE — Progress Notes (Signed)
Primary Care Physician:  [name], MD  Primary Gastroenterologist:  Barney Drain, MD   Chief Complaint  Patient presents with  . Abdominal Pain  . Nausea  . Emesis    HPI:  Natasha Massey is a 48 y.o. female here for further evaluation of abdominal pain and n/v. Gyn, Dr. Julien Girt, recommended GI evaluation prior to consideration of hysterectomy for pelvic pain. Evaluated back in November for pelvic pain via ultrasound and noted to have left ovarian complex cyst measuring 2.6 cm with septations, small right ovarian cyst measuring 14 mm. Follow-up ultrasound this month showed resolution. Because patient was in severe pain she was referred to [facility] emergency department for evaluation. CT abdomen and pelvis with and without contrast showed no acute findings nothing to account for her pain in the left lower quadrant. Focal area of airspace opacity in the lingula incompletely visualized, possible atelectasis versus pneumonia. At that time her white blood cell count was 11,600. LFTs were normal. Urinalysis was unremarkable. Subsequently she has been treated for pneumonia by her PCP recently completed antibiotic therapy and prednisone therapy.  Patient states she's had pelvic pain dating back at least 1 year. Usually happens a couple times per month and last for a couple of days. Last episode was the worst lasting for 4 days and severe in nature. Pain is described as being in the mid abdomen radiates around the flanks to the back. The last time was more specifically located in the left lower quadrant region. Usually with severe pain she does get vomiting however since Thanksgiving she's been having daily vomiting. Previously was able to manage her pain with ibuprofen about 3 times per week at the most. At this time her pain has returned from manageable level. Her bowel function is unchanged and normal. No melena or rectal bleeding. Denies typical heartburn, dysphagia. No hematemesis.  Complains of constant belching. States she was told in the past that she had ulcers but based on blood test that her PCP. Treated with medication for a couple weeks, likely H pylori.   Complains of protrusion in the right upper quadrant when she stands, with intense pain she notices a hardened area in this location.  Current Outpatient Prescriptions  Medication Sig Dispense Refill  . budesonide-formoterol (SYMBICORT) 160-4.5 MCG/ACT inhaler Inhale 2 puffs into the lungs 2 (two) times daily.    . diazepam (VALIUM) 5 MG tablet Take 5 mg by mouth 4 (four) times daily.    Marland Kitchen gabapentin (NEURONTIN) 100 MG capsule Take 200 mg by mouth 2 (two) times daily.     No current facility-administered medications for this visit.    Allergies as of 08/23/2014 - Review Complete 08/23/2014  Allergen Reaction Noted  . Amoxicillin Hives 07/30/2014  . Aspirin Hives 02/03/2008  . Penicillins Swelling 07/30/2014    Past Medical History  Diagnosis Date  . COPD (chronic obstructive pulmonary disease)   . Pneumonia     Past Surgical History  Procedure Laterality Date  . Tubal ligation    . Back surgery  2001  . Endometrial ablation      Family History  Problem Relation Age of Onset  . Colon cancer Neg Hx   . Renal cancer Father   . Lung cancer Sister   . Lung cancer Brother   . Breast cancer Other     several maternal aunts  . Inflammatory bowel disease      History   Social History  . Marital Status: Divorced  Spouse Name: N/A    Number of Children: 2  . Years of Education: N/A   Occupational History  . disability    Social History Main Topics  . Smoking status: Current Every Day Smoker -- 1.00 packs/day for 35 years  . Smokeless tobacco: Never Used  . Alcohol Use: No  . Drug Use: No  . Sexual Activity: Not Currently   Other Topics Concern  . Not on file   Social History Narrative      ROS:  General: Negative for anorexia, weight loss, fever, chills, fatigue,  weakness. Eyes: Negative for vision changes.  ENT: Negative for hoarseness, difficulty swallowing , nasal congestion. CV: Negative for chest pain, angina, palpitations, dyspnea on exertion, peripheral edema.  Respiratory: Negative for dyspnea at rest, dyspnea on exertion, cough, sputum, wheezing.  GI: See history of present illness. GU:  Negative for dysuria, hematuria, urinary incontinence, urinary frequency, nocturnal urination. See history of present illness MS: Negative for joint pain, low back pain.  Derm: Negative for rash or itching.  Neuro: Negative for weakness, abnormal sensation, seizure, frequent headaches, memory loss, confusion.  Psych: Negative for anxiety, depression, suicidal ideation, hallucinations.  Endo: Negative for unusual weight change.  Heme: Negative for bruising or bleeding. Allergy: Negative for rash or hives.    Physical Examination:  BP 114/78 mmHg  Pulse 83  Temp(Src) 97.4 F (36.3 C) (Oral)  Ht 5\' 6"  (1.676 m)  Wt 164 lb 9.6 oz (74.662 kg)  BMI 26.58 kg/m2   General: Well-nourished, well-developed in no acute distress.  Head: Normocephalic, atraumatic.   Eyes: Conjunctiva pink, no icterus. Mouth: Oropharyngeal mucosa moist and pink , no lesions erythema or exudate. Neck: Supple without thyromegaly, masses, or lymphadenopathy.  Lungs: Clear to auscultation bilaterally.  Heart: Regular rate and rhythm, no murmurs rubs or gallops.  Abdomen: Bowel sounds are normal, minimal left lower quadrant tenderness, nondistended, no hepatosplenomegaly or masses, no abdominal bruits or    hernia , no rebound or guarding.  With standing, slight protrusion right upper abdomen close to midline without notable herniation. No mass. Rectal: Not performed Extremities: No lower extremity edema. No clubbing or deformities.  Neuro: Alert and oriented x 4 , grossly normal neurologically.  Skin: Warm and dry, no rash or jaundice.   Psych: Alert and cooperative, normal mood  and affect.  Labs: Lab Results  Component Value Date   WBC 11.6* 07/30/2014   HGB 13.5 07/30/2014   HCT 40.1 07/30/2014   MCV 90.3 07/30/2014   PLT 331 07/30/2014   Lab Results  Component Value Date   CREATININE 0.67 07/30/2014   BUN 8 07/30/2014   NA 140 07/30/2014   K 4.2 07/30/2014   CL 105 07/30/2014   CO2 23 07/30/2014   Lab Results  Component Value Date   ALT 11 07/30/2014   AST 13 07/30/2014   ALKPHOS 66 07/30/2014   BILITOT 0.3 07/30/2014     Imaging Studies: Ct Abdomen Pelvis W Wo Contrast  07/30/2014   CLINICAL DATA:  Initial encounter for Mid abdominal pain radiating down to left lower quadrant for 3 days. Nausea with low-grade fever and diarrhea.  EXAM: CT ABDOMEN AND PELVIS WITHOUT AND WITH CONTRAST  TECHNIQUE: Multidetector CT imaging of the abdomen and pelvis was performed following the standard protocol before and following the bolus administration of intravenous contrast.  CONTRAST:  149mL OMNIPAQUE IOHEXOL 300 MG/ML  SOLN  COMPARISON:  08/24/2011.  FINDINGS: Lower chest: Incompletely visualized atelectasis or pneumonia seen at  the base of the lingula. There is some subsegmental atelectasis in the posterior left lower lobe.  Hepatobiliary: Stable 14 mm bilobed left hepatic cyst. Other scattered tiny hepatic cysts are stable. There is no evidence for gallstones, gallbladder wall thickening, or pericholecystic fluid. No intrahepatic or extrahepatic biliary dilation. Slight intrahepatic biliary prominence is unchanged.  Pancreas: No focal mass lesion. No dilatation of the main duct. No intraparenchymal cyst. No peripancreatic edema.  Spleen: No splenomegaly. No focal mass lesion.  Adrenals/Urinary Tract: No adrenal nodule or mass. Stable tiny bilateral low-density renal lesions, likely reflecting cysts. No ureteral dilatation. Bladder is unremarkable.  Stomach/Bowel: Stomach is nondistended. No gastric wall thickening. No evidence of outlet obstruction. Duodenum is  normally positioned as is the ligament of Treitz. No small bowel wall thickening. No small bowel dilatation. Terminal ileum is normal. The appendix is located along the right pelvic sidewall and is normal. No gross colonic mass. No colonic wall thickening. No substantial diverticular change.  Vascular/Lymphatic: No abdominal aortic aneurysm. There is no lymphadenopathy in the abdomen or pelvis.  Reproductive: Fibroid change noted in the uterus.  No adnexal mass.  Other: No intraperitoneal free fluid.  Musculoskeletal: Bone windows reveal no worrisome lytic or sclerotic osseous lesions. Status post L5-S1 fusion.  IMPRESSION: No acute findings in the abdomen or pelvis. Specifically, no findings to explain the patient's history of abdominal pain radiating to the left lower quadrant. No colonic diverticulitis or adnexal mass.  Focal area of airspace opacity in the lingula has been incompletely visualized. This could reflect atelectasis or pneumonia.   Electronically Signed   By: Misty Stanley M.D.   On: 07/30/2014 14:41

## 2014-08-23 NOTE — Assessment & Plan Note (Signed)
48 year old lady with chronic intermittent abdominal pain with worsening symptoms recently requiring ER evaluation with findings as outlined above. Symptoms associated with daily nausea and vomiting in the last several weeks. Low-dose ibuprofen use for pain. No change in bowel function. Possible prior history of H. pylori treatment as described. Vomiting might be response to intense pain however more recently pain has been manageable but she has daily vomiting. Doubt we are dealing with obstruction with unremarkable CT. Location of pain not consistent with biliary etiology. Explained to patient today that her symptom complex may not be explained by 1 etiology. We will start PPI therapy empirically for gastritis/peptic ulcer disease. I will review her CT with radiologist regarding possible abdominal wall hernia/patient's complaint of abnormal abdominal contour in the right upper quadrant. Will address all findings with Dr. fields first of the week. Patient will likely need to have an upper endoscopy plus or minus colonoscopy to complete her GI workup.

## 2014-08-23 NOTE — Patient Instructions (Signed)
1. Start Prilosec (omeprazole) one daily before breakfast. 2. I will review your CT with the radiologist and discuss case with Dr. Oneida Alar first of next week.  3. Please touch base with your PCP about ongoing respiratory issues/concerns.

## 2014-08-28 NOTE — Progress Notes (Signed)
cc'ed to pcp °

## 2014-08-30 ENCOUNTER — Telehealth: Payer: Self-pay

## 2014-08-30 NOTE — Telephone Encounter (Signed)
Pt called and was seen by Neil Crouch, PA on 08/24/2015. She is calling to see if Magda Paganini has discussed her plan of care with Dr. Oneida Alar. She is still having nausea but no vomiting.  Please advise!

## 2014-08-30 NOTE — Progress Notes (Signed)
REVIEWED. DISCUSSED WITH LL.

## 2014-08-30 NOTE — Telephone Encounter (Signed)
REVIEWED.  

## 2014-08-31 MED ORDER — ONDANSETRON HCL 4 MG PO TABS: 4.0000 mg | ORAL_TABLET | Freq: Three times a day (TID) | ORAL | Status: DC | PRN

## 2014-08-31 NOTE — Telephone Encounter (Signed)
See addendum to my OV note.

## 2014-08-31 NOTE — Progress Notes (Signed)
Reviewed CT with Dr. Carlis Abbott, radiologist. Nothing to explain protrusion on ruq region. No hernia or masses.   Discussed with patient. Still feeling very nauseated. No vomiting for few days. BM remain regular. Lot of belching. prilosec has not helped yet.   RX for zofran sent in to pharmacy.  Await input from Dr. Oneida Alar. Patient may need EGD for ugi symptoms. Dr. Oneida Alar did not recommend colonoscopy for abdominal pain given she has had NO bowel issues.

## 2014-08-31 NOTE — Addendum Note (Signed)
Addended by: Mahala Menghini on: 08/31/2014 03:35 PM   Modules accepted: Orders

## 2014-09-03 ENCOUNTER — Encounter: Payer: Self-pay | Admitting: Gastroenterology

## 2014-09-04 ENCOUNTER — Other Ambulatory Visit: Payer: Self-pay

## 2014-09-04 NOTE — Telephone Encounter (Signed)
Please speak with patient today about plan. Dr. Oneida Alar recommends EGD JAN 48 W/ MAC (polypharmacy).

## 2014-09-04 NOTE — Telephone Encounter (Signed)
REVIEWED. AGREE. NO ADDITIONAL RECOMMENDATIONS. 

## 2014-09-04 NOTE — Telephone Encounter (Signed)
Spoke with pt she is scheduled for 09/11/2014 with [facility].  Pt aware and has pre-op appt. For 09/05/2014.  Mailed instructions.

## 2014-09-04 NOTE — Progress Notes (Signed)
REVIEWED. EGD WITH MAC NEXT TUE.

## 2014-09-05 ENCOUNTER — Encounter (HOSPITAL_COMMUNITY): Payer: Self-pay

## 2014-09-05 ENCOUNTER — Encounter (HOSPITAL_COMMUNITY)
Admission: RE | Admit: 2014-09-05 | Discharge: 2014-09-05 | Disposition: A | Discharge status: Home / Self Care | Payer: Medicare Other | Source: Ambulatory Visit | Attending: Gastroenterology | Admitting: Gastroenterology

## 2014-09-05 DIAGNOSIS — Z01818 Encounter for other preprocedural examination: Secondary | ICD-10-CM | POA: Diagnosis present

## 2014-09-05 DIAGNOSIS — R109 Unspecified abdominal pain: Secondary | ICD-10-CM | POA: Diagnosis not present

## 2014-09-05 DIAGNOSIS — K219 Gastro-esophageal reflux disease without esophagitis: Secondary | ICD-10-CM | POA: Diagnosis not present

## 2014-09-05 DIAGNOSIS — R112 Nausea with vomiting, unspecified: Secondary | ICD-10-CM | POA: Diagnosis not present

## 2014-09-05 HISTORY — DX: Gastro-esophageal reflux disease without esophagitis: K21.9

## 2014-09-05 HISTORY — DX: Polyneuropathy, unspecified: G62.9

## 2014-09-05 HISTORY — DX: Anxiety disorder, unspecified: F41.9

## 2014-09-05 LAB — BASIC METABOLIC PANEL
Anion gap: 7 (ref 5–15)
BUN: 11 mg/dL (ref 6–23)
CHLORIDE: 106 meq/L (ref 96–112)
CO2: 24 mmol/L (ref 19–32)
CREATININE: 0.68 mg/dL (ref 0.50–1.10)
Calcium: 9.2 mg/dL (ref 8.4–10.5)
GFR calc non Af Amer: >90 mL/min (ref >90)
GLUCOSE: 92 mg/dL (ref 70–99)
POTASSIUM: 4.3 mmol/L (ref 3.5–5.1)
SODIUM: 137 mmol/L (ref 135–145)

## 2014-09-05 LAB — CBC
HCT: 41 % (ref 36.0–46.0)
Hemoglobin: 13.8 g/dL (ref 12.0–15.0)
MCH: 30.8 pg (ref 26.0–34.0)
MCHC: 33.7 g/dL (ref 30.0–36.0)
MCV: 91.5 fL (ref 78.0–100.0)
PLATELETS: 362 10*3/uL (ref 150–400)
RBC: 4.48 MIL/uL (ref 3.87–5.11)
RDW: 14.2 % (ref 11.5–15.5)
WBC: 8.1 10*3/uL (ref 4.0–10.5)

## 2014-09-05 NOTE — Pre-Procedure Instructions (Signed)
Patient given information to sign up for my chart at home. 

## 2014-09-05 NOTE — Progress Notes (Signed)
Forward to [place] who scheduled it.

## 2014-09-05 NOTE — Patient Instructions (Signed)
Natasha Massey  09/05/2014   Your procedure is scheduled on:  1/19/206  Report to [facility] at  76  AM.  Call this number if you have problems the morning of surgery: [phone]   Remember:   Do not eat food or drink liquids after midnight.   Take these medicines the morning of surgery with A SIP OF WATER: valium, gabapentin, prilosec, zofran. Take your proair before you come.   Do not wear jewelry, make-up or nail polish.  Do not wear lotions, powders, or perfumes.   Do not shave 48 hours prior to surgery. Men may shave face and neck.  Do not bring valuables to the hospital.  [facility] is not responsible for any belongings or valuables.               Contacts, dentures or bridgework may not be worn into surgery.  Leave suitcase in the car. After surgery it may be brought to your room.  For patients admitted to the hospital, discharge time is determined by your treatment team.               Patients discharged the day of surgery will not be allowed to drive home.  Name and phone number of your driver: family  Special Instructions: N/A   Please read over the following fact sheets that you were given: Pain Booklet, Coughing and Deep Breathing, Surgical Site Infection Prevention, Anesthesia Post-op Instructions and Care and Recovery After Surgery Esophagogastroduodenoscopy Esophagogastroduodenoscopy (EGD) is a procedure to examine the lining of the esophagus, stomach, and first part of the small intestine (duodenum). A long, flexible, lighted tube with a camera attached (endoscope) is inserted down the throat to view these organs. This procedure is done to detect problems or abnormalities, such as inflammation, bleeding, ulcers, or growths, in order to treat them. The procedure lasts about 5-20 minutes. It is usually an outpatient procedure, but it may need to be performed in emergency cases in the hospital. LET YOUR CAREGIVER KNOW ABOUT:   Allergies to food or  medicine.  All medicines you are taking, including vitamins, herbs, eyedrops, and over-the-counter medicines and creams.  Use of steroids (by mouth or creams).  Previous problems you or members of your family have had with the use of anesthetics.  Any blood disorders you have.  Previous surgeries you have had.  Other health problems you have.  Possibility of pregnancy, if this applies. RISKS AND COMPLICATIONS  Generally, EGD is a safe procedure. However, as with any procedure, complications can occur. Possible complications include:  Infection.  Bleeding.  Tearing (perforation) of the esophagus, stomach, or duodenum.  Difficulty breathing or not being able to breath.  Excessive sweating.  Spasms of the larynx.  Slowed heartbeat.  Low blood pressure. BEFORE THE PROCEDURE  Do not eat or drink anything for 6-8 hours before the procedure or as directed by your caregiver.  Ask your caregiver about changing or stopping your regular medicines.  If you wear dentures, be prepared to remove them before the procedure.  Arrange for someone to drive you home after the procedure. PROCEDURE   A vein will be accessed to give medicines and fluids. A medicine to relax you (sedative) and a pain reliever will be given through that access into the vein.  A numbing medicine (local anesthetic) may be sprayed on your throat for comfort and to stop you from gagging or coughing.  A mouth guard may be placed in your  mouth to protect your teeth and to keep you from biting on the endoscope.  You will be asked to lie on your left side.  The endoscope is inserted down your throat and into the esophagus, stomach, and duodenum.  Air is put through the endoscope to allow your caregiver to view the lining of your esophagus clearly.  The esophagus, stomach, and duodenum is then examined. During the exam, your caregiver may:  Remove tissue to be examined under a microscope (biopsy) for  inflammation, infection, or other medical problems.  Remove growths.  Remove objects (foreign bodies) that are stuck.  Treat any bleeding with medicines or other devices that stop tissues from bleeding (hot cautery, clipping devices).  Widen (dilate) or stretch narrowed areas of the esophagus and stomach.  The endoscope will then be withdrawn. AFTER THE PROCEDURE  You will be taken to a recovery area to be monitored. You will be able to go home once you are stable and alert.  Do not eat or drink anything until the local anesthetic and numbing medicines have worn off. You may choke.  It is normal to feel bloated, have pain with swallowing, or have a sore throat for a short time. This will wear off.  Your caregiver should be able to discuss his or her findings with you. It will take longer to discuss the test results if any biopsies were taken. Document Released: 12/11/2004 Document Revised: 12/25/2013 Document Reviewed: 07/13/2012 [facility] Patient Information 2015 [place], [place]. This information is not intended to replace advice given to you by your health care provider. Make sure you discuss any questions you have with your health care provider. PATIENT INSTRUCTIONS POST-ANESTHESIA  IMMEDIATELY FOLLOWING SURGERY:  Do not drive or operate machinery for the first twenty four hours after surgery.  Do not make any important decisions for twenty four hours after surgery or while taking narcotic pain medications or sedatives.  If you develop intractable nausea and vomiting or a severe headache please notify your doctor immediately.  FOLLOW-UP:  Please make an appointment with your surgeon as instructed. You do not need to follow up with anesthesia unless specifically instructed to do so.  WOUND CARE INSTRUCTIONS (if applicable):  Keep a dry clean dressing on the anesthesia/puncture wound site if there is drainage.  Once the wound has quit draining you may leave it open to air.  Generally you  should leave the bandage intact for twenty four hours unless there is drainage.  If the epidural site drains for more than 36-48 hours please call the anesthesia department.  QUESTIONS?:  Please feel free to call your physician or the hospital operator if you have any questions, and they will be happy to assist you.

## 2014-09-11 ENCOUNTER — Encounter (HOSPITAL_COMMUNITY): Payer: Self-pay | Admitting: *Deleted

## 2014-09-11 ENCOUNTER — Ambulatory Visit (HOSPITAL_COMMUNITY)
Admission: RE | Admit: 2014-09-11 | Discharge: 2014-09-11 | Disposition: A | Discharge status: Home / Self Care | Payer: Medicare Other | Source: Ambulatory Visit | Attending: Gastroenterology | Admitting: Gastroenterology

## 2014-09-11 ENCOUNTER — Encounter (HOSPITAL_COMMUNITY)
Admission: RE | Disposition: A | Discharge status: Home / Self Care | Payer: Self-pay | Source: Ambulatory Visit | Attending: Gastroenterology

## 2014-09-11 ENCOUNTER — Ambulatory Visit (HOSPITAL_COMMUNITY): Payer: Medicare Other | Admitting: Anesthesiology

## 2014-09-11 DIAGNOSIS — Z881 Allergy status to other antibiotic agents status: Secondary | ICD-10-CM | POA: Diagnosis not present

## 2014-09-11 DIAGNOSIS — F419 Anxiety disorder, unspecified: Secondary | ICD-10-CM | POA: Insufficient documentation

## 2014-09-11 DIAGNOSIS — K2980 Duodenitis without bleeding: Secondary | ICD-10-CM | POA: Insufficient documentation

## 2014-09-11 DIAGNOSIS — K219 Gastro-esophageal reflux disease without esophagitis: Secondary | ICD-10-CM | POA: Diagnosis not present

## 2014-09-11 DIAGNOSIS — J449 Chronic obstructive pulmonary disease, unspecified: Secondary | ICD-10-CM | POA: Diagnosis not present

## 2014-09-11 DIAGNOSIS — K2950 Unspecified chronic gastritis without bleeding: Secondary | ICD-10-CM | POA: Diagnosis not present

## 2014-09-11 DIAGNOSIS — K319 Disease of stomach and duodenum, unspecified: Secondary | ICD-10-CM

## 2014-09-11 DIAGNOSIS — Z8051 Family history of malignant neoplasm of kidney: Secondary | ICD-10-CM | POA: Insufficient documentation

## 2014-09-11 DIAGNOSIS — G629 Polyneuropathy, unspecified: Secondary | ICD-10-CM | POA: Diagnosis not present

## 2014-09-11 DIAGNOSIS — R1013 Epigastric pain: Secondary | ICD-10-CM | POA: Diagnosis present

## 2014-09-11 DIAGNOSIS — K2970 Gastritis, unspecified, without bleeding: Secondary | ICD-10-CM

## 2014-09-11 DIAGNOSIS — Z801 Family history of malignant neoplasm of trachea, bronchus and lung: Secondary | ICD-10-CM | POA: Insufficient documentation

## 2014-09-11 DIAGNOSIS — F17210 Nicotine dependence, cigarettes, uncomplicated: Secondary | ICD-10-CM | POA: Diagnosis not present

## 2014-09-11 DIAGNOSIS — K449 Diaphragmatic hernia without obstruction or gangrene: Secondary | ICD-10-CM | POA: Diagnosis not present

## 2014-09-11 DIAGNOSIS — Z800 Family history of malignant neoplasm of digestive organs: Secondary | ICD-10-CM | POA: Insufficient documentation

## 2014-09-11 DIAGNOSIS — B9681 Helicobacter pylori [H. pylori] as the cause of diseases classified elsewhere: Secondary | ICD-10-CM | POA: Insufficient documentation

## 2014-09-11 DIAGNOSIS — Z884 Allergy status to anesthetic agent status: Secondary | ICD-10-CM | POA: Diagnosis not present

## 2014-09-11 DIAGNOSIS — Z880 Allergy status to penicillin: Secondary | ICD-10-CM | POA: Diagnosis not present

## 2014-09-11 HISTORY — PX: ESOPHAGOGASTRODUODENOSCOPY (EGD) WITH PROPOFOL: SHX5813

## 2014-09-11 HISTORY — PX: BIOPSY: SHX5522

## 2014-09-11 SURGERY — ESOPHAGOGASTRODUODENOSCOPY (EGD) WITH PROPOFOL
Anesthesia: Monitor Anesthesia Care

## 2014-09-11 MED ORDER — DEXAMETHASONE SODIUM PHOSPHATE 4 MG/ML IJ SOLN
INTRAMUSCULAR | Status: AC
  Filled 2014-09-11: qty 1

## 2014-09-11 MED ORDER — LIDOCAINE HCL (CARDIAC) 10 MG/ML IV SOLN
INTRAVENOUS | Status: DC | PRN
  Administered 2014-09-11: 50 mg via INTRAVENOUS

## 2014-09-11 MED ORDER — FENTANYL CITRATE 0.05 MG/ML IJ SOLN
INTRAMUSCULAR | Status: AC
  Filled 2014-09-11: qty 2

## 2014-09-11 MED ORDER — MIDAZOLAM HCL 5 MG/5ML IJ SOLN
INTRAMUSCULAR | Status: DC | PRN
  Administered 2014-09-11 (×2): 1 mg via INTRAVENOUS

## 2014-09-11 MED ORDER — DEXAMETHASONE SODIUM PHOSPHATE 4 MG/ML IJ SOLN
4.0000 mg | Freq: Once | INTRAMUSCULAR | Status: AC
  Administered 2014-09-11: 4 mg via INTRAVENOUS

## 2014-09-11 MED ORDER — LIDOCAINE VISCOUS 2 % MT SOLN
6.0000 mL | Freq: Once | OROMUCOSAL | Status: AC
  Administered 2014-09-11: 6 mL via OROMUCOSAL

## 2014-09-11 MED ORDER — LACTATED RINGERS IV SOLN
INTRAVENOUS | Status: DC | PRN
  Administered 2014-09-11: 07:00:00 via INTRAVENOUS

## 2014-09-11 MED ORDER — SODIUM CHLORIDE 0.9 % IJ SOLN
INTRAMUSCULAR | Status: AC
  Filled 2014-09-11: qty 10

## 2014-09-11 MED ORDER — SIMETHICONE 40 MG/0.6ML PO SUSP
ORAL | Status: AC
  Filled 2014-09-11: qty 0.6

## 2014-09-11 MED ORDER — MIDAZOLAM HCL 2 MG/2ML IJ SOLN
1.0000 mg | INTRAMUSCULAR | Status: DC | PRN
  Administered 2014-09-11: 2 mg via INTRAVENOUS

## 2014-09-11 MED ORDER — FENTANYL CITRATE 0.05 MG/ML IJ SOLN: 25.0000 ug | INTRAMUSCULAR | Status: DC | PRN

## 2014-09-11 MED ORDER — LIDOCAINE VISCOUS 2 % MT SOLN
OROMUCOSAL | Status: DC | PRN
  Administered 2014-09-11: 1 via OROMUCOSAL

## 2014-09-11 MED ORDER — FENTANYL CITRATE 0.05 MG/ML IJ SOLN
25.0000 ug | INTRAMUSCULAR | Status: AC
  Administered 2014-09-11 (×2): 25 ug via INTRAVENOUS

## 2014-09-11 MED ORDER — LACTATED RINGERS IV SOLN
INTRAVENOUS | Status: DC
  Administered 2014-09-11: 07:00:00 via INTRAVENOUS

## 2014-09-11 MED ORDER — MIDAZOLAM HCL 2 MG/2ML IJ SOLN
INTRAMUSCULAR | Status: AC
  Filled 2014-09-11: qty 2

## 2014-09-11 MED ORDER — ONDANSETRON HCL 4 MG/2ML IJ SOLN
INTRAMUSCULAR | Status: AC
  Filled 2014-09-11: qty 2

## 2014-09-11 MED ORDER — LIDOCAINE HCL (PF) 1 % IJ SOLN
INTRAMUSCULAR | Status: AC
  Filled 2014-09-11: qty 5

## 2014-09-11 MED ORDER — PROPOFOL 10 MG/ML IV BOLUS
INTRAVENOUS | Status: AC
  Filled 2014-09-11: qty 20

## 2014-09-11 MED ORDER — PROPOFOL INFUSION 10 MG/ML OPTIME
INTRAVENOUS | Status: DC | PRN
  Administered 2014-09-11: 75 ug/kg/min via INTRAVENOUS

## 2014-09-11 MED ORDER — LIDOCAINE VISCOUS 2 % MT SOLN
OROMUCOSAL | Status: AC
  Administered 2014-09-11: 6 mL via OROMUCOSAL
  Filled 2014-09-11: qty 15

## 2014-09-11 MED ORDER — ONDANSETRON HCL 4 MG/2ML IJ SOLN: 4.0000 mg | Freq: Once | INTRAMUSCULAR | Status: DC | PRN

## 2014-09-11 MED ORDER — EPHEDRINE SULFATE 50 MG/ML IJ SOLN
INTRAMUSCULAR | Status: AC
  Filled 2014-09-11: qty 1

## 2014-09-11 MED ORDER — ONDANSETRON HCL 4 MG/2ML IJ SOLN
4.0000 mg | Freq: Once | INTRAMUSCULAR | Status: AC
  Administered 2014-09-11: 4 mg via INTRAVENOUS

## 2014-09-11 MED ORDER — STERILE WATER FOR IRRIGATION IR SOLN
Status: DC | PRN
  Administered 2014-09-11: 1000 mL

## 2014-09-11 MED ORDER — FENTANYL CITRATE 0.05 MG/ML IJ SOLN
INTRAMUSCULAR | Status: DC | PRN
  Administered 2014-09-11: 25 ug via INTRAVENOUS
  Administered 2014-09-11: 50 ug via INTRAVENOUS
  Administered 2014-09-11: 25 ug via INTRAVENOUS

## 2014-09-11 MED ORDER — OMEPRAZOLE 20 MG PO CPDR: DELAYED_RELEASE_CAPSULE | ORAL | Status: DC

## 2014-09-11 MED ORDER — SUCCINYLCHOLINE CHLORIDE 20 MG/ML IJ SOLN
INTRAMUSCULAR | Status: AC
  Filled 2014-09-11: qty 1

## 2014-09-11 SURGICAL SUPPLY — 19 items
BLOCK BITE 60FR ADLT L/F BLUE (MISCELLANEOUS) ×2 IMPLANT
ELECT REM PT RETURN 9FT ADLT (ELECTROSURGICAL)
ELECTRODE REM PT RTRN 9FT ADLT (ELECTROSURGICAL) IMPLANT
FLOOR PAD 36X40 (MISCELLANEOUS) ×3
FORCEPS BIOP RAD 4 LRG CAP 4 (CUTTING FORCEPS) ×2 IMPLANT
FORMALIN 10 PREFIL 20ML (MISCELLANEOUS) ×4 IMPLANT
KIT CLEAN ENDO COMPLIANCE (KITS) ×2 IMPLANT
MANIFOLD NEPTUNE II (INSTRUMENTS) ×3 IMPLANT
NDL SCLEROTHERAPY 25GX240 (NEEDLE) IMPLANT
NEEDLE SCLEROTHERAPY 25GX240 (NEEDLE) IMPLANT
PAD FLOOR 36X40 (MISCELLANEOUS) IMPLANT
PROBE APC STR FIRE (PROBE) IMPLANT
PROBE INJECTION GOLD (MISCELLANEOUS)
PROBE INJECTION GOLD 7FR (MISCELLANEOUS) IMPLANT
SNARE SHORT THROW 13M SML OVAL (MISCELLANEOUS) IMPLANT
SYR 50ML LL SCALE MARK (SYRINGE) ×2 IMPLANT
SYR INFLATION 60ML (SYRINGE) IMPLANT
TUBING IRRIGATION ENDOGATOR (MISCELLANEOUS) ×2 IMPLANT
WATER STERILE IRR 1000ML POUR (IV SOLUTION) ×2 IMPLANT

## 2014-09-11 NOTE — Anesthesia Postprocedure Evaluation (Signed)
  Anesthesia Post-op Note  Patient: Natasha Massey  Procedure(s) Performed: Procedure(s): ESOPHAGOGASTRODUODENOSCOPY (EGD) WITH PROPOFOL (N/A) DUODENAL AND GASTRIC BIOPSIES (N/A)  Patient Location: PACU  Anesthesia Type:MAC  Level of Consciousness: awake, alert , oriented and patient cooperative  Airway and Oxygen Therapy: Patient Spontanous Breathing and Patient connected to face mask oxygen  Post-op Pain: none  Post-op Assessment: Post-op Vital signs reviewed, Patient's Cardiovascular Status Stable, Respiratory Function Stable, Patent Airway, No signs of Nausea or vomiting and Pain level controlled  Post-op Vital Signs: Reviewed and stable  Last Vitals:  Filed Vitals:   09/11/14 0725  BP:   Pulse:   Temp:   Resp: 18    Complications: No apparent anesthesia complications

## 2014-09-11 NOTE — Op Note (Signed)
NAME:  Natasha Massey, Natasha Massey NO.:  [id number]  MEDICAL RECORD NO.:  [id number]  LOCATION:  [facility]                          FACILITY:  [facility]  PHYSICIAN:  Barney Drain, M.D.     DATE OF BIRTH:  October 01, 1965  DATE OF PROCEDURE:  09/11/2014 DATE OF DISCHARGE:  09/11/2014                              OPERATIVE REPORT   PROCEDURE:  Esophagogastroduodenoscopy with cold forceps biopsy of the gastric and duodenal mucosa.  INDICATIONS FOR EXAM:  Natasha Massey is a 49 year old female, who presented with dyspepsia.  She has nausea and vomiting 1-2 times a month.  She complains of constant belching and her symptoms have not responded to Prilosec. She has a history of tobacco abuse and gastroesophageal reflux disease.She uses ibuprofen infrequently.  FINDINGS: 1. Normal esophageal mucosa. 2. Small hiatal hernia. 3. Moderate nonerosive erythema and edema in the antrum.  Cold forceps     biopsies obtained. 4. Mild erythema and edema in the duodenal bulb.  Normal second     portion of the duodenum.  Cold forceps biopsies obtained to     evaluate for eosinophilic gastritis. 5. Nausea and vomiting most likely due to reflux, gastritis, and     duodenitis.  DIAGNOSES: 1. Moderate nonerosive gastritis. 2. Small hiatal hernia. 3. Mild duodenal bulb duodenitis.  RECOMMENDATIONS: 1. Take omeprazole 30 minutes prior to meals twice daily. 2. Follow low-fat diet. 3. Cut down on cigarette or tobacco use. 4. Await biopsies. 5. Follow up in May 2016.  MEDICATIONS:  Propofol.  PROCEDURE TECHNIQUE:  Physical exam was performed and informed consent was obtained from the patient after explaining benefits, risks, and alternatives to the procedure.  The patient was connected to the monitor and placed in left lateral position.  Continuous oxygen was provided by nasal cannula.  IV medicine administered by nasal cannula.  She has an indwelling cannula.  After administration of sedation, the  patient's esophagus was intubated.  Scope was advanced under direct visualization to the second portion of the duodenum.  Scope was removed followed by careful exam of color, texture, anatomy, and integrity of the mucosal way out.  The patient was recovered in endoscopy and discharged home in satisfactory condition.     Barney Drain, M.D.     SF/MEDQ  D:  09/11/2014  T:  09/11/2014  Job:  [id number]  cc:   Margarita Rana, M.D. Fax: [phone]

## 2014-09-11 NOTE — Discharge Instructions (Signed)
Your nausea and vomiting is most likely due to reflux, gastritis, and duodenitis. You have  A SMALL HIATAL HERNIA, gastritis & DUODENITIS DUE TO YOUR USING IBUPROFEN. I biopsied your stomach & SMALL BOWEL.   TAKE OMEPRAZOLE 30 MINUTES PRIOR TO MEALS TWICE DAILY.  FOLLOW A LOW FAT DIET. SEE INFO BELOW.  CUT DOWN ON YOUR SMOKING.  YOUR BIOPSY WILL BE BACK IN 14 DAYS OR YOU CAN LOOK THEM UP ON MY CHART AFTER JAN 22.  FOLLOW UP IN MAY 2016.  UPPER ENDOSCOPY AFTER CARE Read the instructions outlined below and refer to this sheet in the next week. These discharge instructions provide you with general information on caring for yourself after you leave the hospital. While your treatment has been planned according to the most current medical practices available, unavoidable complications occasionally occur. If you have any problems or questions after discharge, call DR. Adain Geurin, [phone].  ACTIVITY  You may resume your regular activity, but move at a slower pace for the next 24 hours.   Take frequent rest periods for the next 24 hours.   Walking will help get rid of the air and reduce the bloated feeling in your belly (abdomen).   No driving for 24 hours (because of the medicine (anesthesia) used during the test).   You may shower.   Do not sign any important legal documents or operate any machinery for 24 hours (because of the anesthesia used during the test).    NUTRITION  Drink plenty of fluids.   You may resume your normal diet as instructed by your doctor.   Begin with a light meal and progress to your normal diet. Heavy or fried foods are harder to digest and may make you feel sick to your stomach (nauseated).   Avoid alcoholic beverages for 24 hours or as instructed.    MEDICATIONS  You may resume your normal medications.   WHAT YOU CAN EXPECT TODAY  Some feelings of bloating in the abdomen.   Passage of more gas than usual.    IF YOU HAD A BIOPSY TAKEN DURING  THE UPPER ENDOSCOPY:  Eat a soft diet IF YOU HAVE NAUSEA, BLOATING, ABDOMINAL PAIN, OR VOMITING.    FINDING OUT THE RESULTS OF YOUR TEST Not all test results are available during your visit. DR. Oneida Alar WILL CALL YOU WITHIN 14 DAYS OF YOUR PROCEDUE WITH YOUR RESULTS. Do not assume everything is normal if you have not heard from DR. Kenlyn Lose CALL HER OFFICE AT [phone].  SEEK IMMEDIATE MEDICAL ATTENTION AND CALL THE OFFICE: [phone] IF:  You have more than a spotting of blood in your stool.   Your belly is swollen (abdominal distention).   You are nauseated or vomiting.   You have a temperature over 101F.   You have abdominal pain or discomfort that is severe or gets worse throughout the day.   Gastritis/DUODENITIS  Gastritis is an inflammation (the body's way of reacting to injury and/or infection) of the stomach. DUODENITIS is an inflammation (the body's way of reacting to injury and/or infection) of the FIRST PART OF THE SMALL INTESTINES. It is often caused by bacterial (germ) infections. It can also be caused BY ASPIRIN, BC/GOODY POWDER'S, (IBUPROFEN) MOTRIN, OR ALEVE (NAPROXEN), chemicals (including alcohol), SPICY FOODS, and medications. This illness may be associated with generalized malaise (feeling tired, not well), UPPER ABDOMINAL STOMACH cramps, and fever. One common bacterial cause of gastritis is an organism known as H. Pylori. This can be treated with antibiotics.  REFLUX   TREATMENT There are a number of  medicines used to treat reflux including: Antacids.  ZANTAC Proton-pump inhibitors: PRILOSEC   HOME CARE INSTRUCTIONS Eat 2-3 hours before going to bed.  Try to reach and maintain a healthy weight.  Do not eat just a few very large meals. Instead, eat 4 TO 6 smaller meals throughout the day.  Try to identify foods and beverages that make your symptoms worse, and avoid these.  Avoid tight clothing.     Low-Fat Diet BREADS, CEREALS, PASTA, RICE,  DRIED PEAS, AND BEANS These products are high in carbohydrates and most are low in fat. Therefore, they can be increased in the diet as substitutes for fatty foods. They too, however, contain calories and should not be eaten in excess. Cereals can be eaten for snacks as well as for breakfast.  Include foods that contain fiber (fruits, vegetables, whole grains, and legumes). Research shows that fiber may lower blood cholesterol levels, especially the water-soluble fiber found in fruits, vegetables, oat products, and legumes. FRUITS AND VEGETABLES It is good to eat fruits and vegetables. Besides being sources of fiber, both are rich in vitamins and some minerals. They help you get the daily allowances of these nutrients. Fruits and vegetables can be used for snacks and desserts. MEATS Limit lean meat, chicken, [place], and fish to no more than 6 ounces per day. Beef, Pork, and Lamb Use lean cuts of beef, pork, and lamb. Lean cuts include:  Extra-lean ground beef.  Arm roast.  Sirloin tip.  Center-cut ham.  Round steak.  Loin chops.  Rump roast.  Tenderloin.  Trim all fat off the outside of meats before cooking. It is not necessary to severely decrease the intake of red meat, but lean choices should be made. Lean meat is rich in protein and contains a highly absorbable form of iron. Premenopausal women, in particular, should avoid reducing lean red meat because this could increase the risk for low red blood cells (iron-deficiency anemia).  Chicken and [place] These are good sources of protein. The fat of poultry can be reduced by removing the skin and underlying fat layers before cooking. Chicken and [place] can be substituted for lean red meat in the diet. Poultry should not be fried or covered with high-fat sauces. Fish and Shellfish Fish is a good source of protein. Shellfish contain cholesterol, but they usually are low in saturated fatty acids. The preparation of fish is important. Like chicken  and [place], they should not be fried or covered with high-fat sauces. EGGS Egg whites contain no fat or cholesterol. They can be eaten often. Try 1 to 2 egg whites instead of whole eggs in recipes or use egg substitutes that do not contain yolk.  MILK AND DAIRY PRODUCTS Use skim or 1% milk instead of 2% or whole milk. Decrease whole milk, natural, and processed cheeses. Use nonfat or low-fat (2%) cottage cheese or low-fat cheeses made from vegetable oils. Choose nonfat or low-fat (1 to 2%) yogurt. Experiment with evaporated skim milk in recipes that call for heavy cream. Substitute low-fat yogurt or low-fat cottage cheese for sour cream in dips and salad dressings. Have at least 2 servings of low-fat dairy products, such as 2 glasses of skim (or 1%) milk each day to help get your daily calcium intake.  FATS AND OILS Butterfat, lard, and beef fats are high in saturated fat and cholesterol. These should be avoided.Vegetable fats do not contain cholesterol. AVOID coconut oil, palm oil, and  palm kernel oil, WHICH are very high in saturated fats. These should be limited. These fats are often used in bakery goods, processed foods, popcorn, oils, and nondairy creamers. Vegetable shortenings and some peanut butters contain hydrogenated oils, which are also saturated fats. Read the labels on these foods and check for saturated vegetable oils.  Desirable liquid vegetable oils are corn oil, cottonseed oil, olive oil, canola oil, safflower oil, soybean oil, and sunflower oil. Peanut oil is not as good, but small amounts are acceptable. Buy a heart-healthy tub margarine that has no partially hydrogenated oils in the ingredients. AVOID Mayonnaise and salad dressings often are made from unsaturated fats.  OTHER EATING TIPS Snacks  Most sweets should be limited as snacks. They tend to be rich in calories and fats, and their caloric content outweighs their nutritional value. Some good choices in snacks are graham  crackers, melba toast, soda crackers, bagels (no egg), English muffins, fruits, and vegetables. These snacks are preferable to snack crackers, [place] fries, and chips. Popcorn should be air-popped or cooked in small amounts of liquid vegetable oil.  Desserts Eat fruit, low-fat yogurt, and fruit ices instead of pastries, cake, and cookies. Sherbet, angel food cake, gelatin dessert, frozen low-fat yogurt, or other frozen products that do not contain saturated fat (pure fruit juice bars, frozen ice pops) are also acceptable.   COOKING METHODS Choose those methods that use little or no fat. They include: Poaching.  Braising.  Steaming.  Grilling.  Baking.  Stir-frying.  Broiling.  Microwaving.  Foods can be cooked in a nonstick pan without added fat, or use a nonfat cooking spray in regular cookware. Limit fried foods and avoid frying in saturated fat. Add moisture to lean meats by using water, broth, cooking wines, and other nonfat or low-fat sauces along with the cooking methods mentioned above. Soups and stews should be chilled after cooking. The fat that forms on top after a few hours in the refrigerator should be skimmed off. When preparing meals, avoid using excess salt. Salt can contribute to raising blood pressure in some people.  EATING AWAY FROM HOME Order entres, potatoes, and vegetables without sauces or butter. When meat exceeds the size of a deck of cards (3 to 4 ounces), the rest can be taken home for another meal. Choose vegetable or fruit salads and ask for low-calorie salad dressings to be served on the side. Use dressings sparingly. Limit high-fat toppings, such as bacon, crumbled eggs, cheese, sunflower seeds, and olives. Ask for heart-healthy tub margarine instead of butter.

## 2014-09-11 NOTE — Transfer of Care (Signed)
Immediate Anesthesia Transfer of Care Note  Patient: Natasha Massey  Procedure(s) Performed: Procedure(s): ESOPHAGOGASTRODUODENOSCOPY (EGD) WITH PROPOFOL (N/A) DUODENAL AND GASTRIC BIOPSIES (N/A)  Patient Location: PACU  Anesthesia Type:MAC  Level of Consciousness: awake, alert , oriented and patient cooperative  Airway & Oxygen Therapy: Patient Spontanous Breathing and Patient connected to face mask oxygen  Post-op Assessment: Report given to PACU RN and Post -op Vital signs reviewed and stable  Post vital signs: Reviewed and stable  Complications: No apparent anesthesia complications

## 2014-09-11 NOTE — Anesthesia Preprocedure Evaluation (Signed)
Anesthesia Evaluation  Patient identified by MRN, date of birth, ID band Patient awake    Reviewed: Allergy & Precautions, NPO status , Patient's Chart, lab work & pertinent test results  Airway Mallampati: II  TM Distance: >3 FB Neck ROM: Full    Dental  (+) Teeth Intact, Dental Advisory Given   Pulmonary shortness of breath and with exertion, pneumonia -, resolved, COPD (am cough)Current Smoker,  breath sounds clear to auscultation        Cardiovascular negative cardio ROS  Rhythm:Regular Rate:Normal     Neuro/Psych PSYCHIATRIC DISORDERS Anxiety    GI/Hepatic GERD-  Medicated,  Endo/Other    Renal/GU      Musculoskeletal   Abdominal   Peds  Hematology   Anesthesia Other Findings   Reproductive/Obstetrics                             Anesthesia Physical Anesthesia Plan  ASA: III  Anesthesia Plan: MAC   Post-op Pain Management:    Induction: Intravenous  Airway Management Planned: Simple Face Mask  Additional Equipment:   Intra-op Plan:   Post-operative Plan:   Informed Consent: I have reviewed the patients History and Physical, chart, labs and discussed the procedure including the risks, benefits and alternatives for the proposed anesthesia with the patient or authorized representative who has indicated his/her understanding and acceptance.     Plan Discussed with:   Anesthesia Plan Comments:         Anesthesia Quick Evaluation

## 2014-09-11 NOTE — Op Note (Deleted)
NAME:  Natasha Massey, Natasha Massey NO.:  [id number]  MEDICAL RECORD NO.:  [id number]  LOCATION:  [facility]                          FACILITY:  [facility]  PHYSICIAN:  Barney Drain, M.D.     DATE OF BIRTH:  April 20, 1966  DATE OF CONSULTATION:  09/11/2014 DATE OF DISCHARGE:  09/11/2014                                OPERATIVE REPORT   PROCEDURE:  Esophagogastroduodenoscopy with cold forceps biopsy of the gastric and duodenal mucosa.  INDICATIONS FOR EXAM:  Natasha Massey is a 49 year old female, who presented with dyspepsia.  She has nausea and vomiting 1-2 times a month.  She complains of constant belching and her symptoms have not responded to Prilosec.  PAST MEDICAL HISTORY:  Tobacco abuse and gastroesophageal reflux disease.  MEDICATIONS:  She uses ibuprofen infrequently.  FINDINGS: 1. Normal esophageal mucosa. 2. Small hiatal hernia. 3. Moderate nonerosive erythema and edema in the antrum.  Cold forceps     biopsies obtained. 4. Mild erythema and edema in the duodenal bulb.  Normal second     portion of the duodenum.  Cold forceps biopsies obtained to     evaluate for eosinophilic gastritis. 5. Nausea and vomiting most likely due to reflux, gastritis, and     duodenitis.  DIAGNOSES: 1. Moderate nonerosive gastritis. 2. Small hiatal hernia. 3. Mild duodenal bulb duodenitis.  RECOMMENDATIONS: 1. Take omeprazole 30 minutes prior to meals twice daily. 2. Follow low-fat diet. 3. Cut down on cigarette or tobacco use. 4. Await biopsies. 5. Follow up in May 2016.  MEDICATIONS:  Propofol.  PROCEDURE TECHNIQUE:  Physical exam was performed and informed consent was obtained from the patient after explaining benefits, risks, and alternatives to the procedure.  The patient was connected to the monitor and placed in left lateral position.  Continuous oxygen was provided by nasal cannula.  IV medicine administered by nasal cannula.  She has an indwelling cannula.  After  administration of sedation, the patient's esophagus was intubated.  Scope was advanced under direct visualization to the second portion of the duodenum.  Scope was removed followed by careful exam of color, texture, anatomy, and integrity of the mucosal way out.  The patient was recovered in endoscopy and discharged home in satisfactory condition.     Barney Drain, M.D.     SF/MEDQ  D:  09/11/2014  T:  09/11/2014  Job:  [id number]  cc:   Margarita Rana, M.D. Fax: [phone]

## 2014-09-11 NOTE — Anesthesia Procedure Notes (Signed)
Procedure Name: MAC Date/Time: 09/11/2014 7:27 AM Performed by: Andree Elk, Alaynna Kerwood A Pre-anesthesia Checklist: Patient identified, Timeout performed, Emergency Drugs available, Suction available and Patient being monitored Oxygen Delivery Method: Simple face mask

## 2014-09-11 NOTE — H&P (Signed)
  Primary Care Physician:  [name], MD Primary Gastroenterologist:  Dr. Oneida Alar  Pre-Procedure History & Physical: HPI:  Natasha Massey is a 49 y.o. female here for DYSPEPSIA.  Past Medical History  Diagnosis Date  . COPD (chronic obstructive pulmonary disease)   . Pneumonia   . Anxiety   . GERD (gastroesophageal reflux disease)   . Neuropathy     Past Surgical History  Procedure Laterality Date  . Tubal ligation    . Endometrial ablation    . Back surgery  2001    lumbar disc and cage    Prior to Admission medications   Medication Sig Start Date End Date Taking? Authorizing Provider  diazepam (VALIUM) 5 MG tablet Take 5 mg by mouth 4 (four) times daily.   Yes Historical Provider, MD  gabapentin (NEURONTIN) 100 MG capsule Take 200 mg by mouth 2 (two) times daily.   Yes Historical Provider, MD  omeprazole (PRILOSEC) 20 MG capsule Take 1 capsule (20 mg total) by mouth daily before breakfast. 08/23/14  Yes Mahala Menghini, PA-C  PROAIR HFA 108 (90 BASE) MCG/ACT inhaler Inhale 2 puffs into the lungs every 4 (four) hours as needed. 07/10/14  Yes Historical Provider, MD  ondansetron (ZOFRAN) 4 MG tablet Take 1 tablet (4 mg total) by mouth every 8 (eight) hours as needed for nausea or vomiting. 08/31/14   Mahala Menghini, PA-C    Allergies as of 09/04/2014 - Review Complete 09/04/2014  Allergen Reaction Noted  . Amoxicillin Hives 07/30/2014  . Aspirin Hives 02/03/2008  . Betadine [povidone iodine]  09/04/2014  . Penicillins Swelling 07/30/2014    Family History  Problem Relation Age of Onset  . Colon cancer Neg Hx   . Renal cancer Father   . Lung cancer Sister   . Lung cancer Brother   . Breast cancer Other     several maternal aunts  . Inflammatory bowel disease      History   Social History  . Marital Status: Divorced    Spouse Name: N/A    Number of Children: 2  . Years of Education: N/A   Occupational History  . disability    Social History Main  Topics  . Smoking status: Current Every Day Smoker -- 0.50 packs/day for 35 years    Types: Cigarettes  . Smokeless tobacco: Never Used  . Alcohol Use: No  . Drug Use: No  . Sexual Activity: Not Currently   Other Topics Concern  . Not on file   Social History Narrative    Review of Systems: See HPI, otherwise negative ROS   Physical Exam: BP 107/73 mmHg  Pulse 65  Temp(Src) 98.8 F (37.1 C) (Oral)  Resp 18  SpO2 96% General:   Alert,  pleasant and cooperative in NAD Head:  Normocephalic and atraumatic. Neck:  Supple; Lungs:  Clear throughout to auscultation.    Heart:  Regular rate and rhythm. Abdomen:  Soft, nontender and nondistended. Normal bowel sounds, without guarding, and without rebound.   Neurologic:  Alert and  oriented x4;  grossly normal neurologically.  Impression/Plan:     DYSPEPSIA  PLAN:  EGD TODAY

## 2014-09-13 ENCOUNTER — Encounter (HOSPITAL_COMMUNITY): Payer: Self-pay | Admitting: Gastroenterology

## 2014-09-26 ENCOUNTER — Telehealth: Payer: Self-pay | Admitting: Gastroenterology

## 2014-09-26 MED ORDER — BIS SUBCIT-METRONID-TETRACYC 140-125-125 MG PO CAPS: ORAL_CAPSULE | ORAL | Status: DC

## 2014-09-26 NOTE — Addendum Note (Signed)
Addended by: Danie Binder on: 09/26/2014 01:48 PM   Modules accepted: Orders

## 2014-09-26 NOTE — Telephone Encounter (Signed)
Pt said she is supposed to have her uterus removed by Dr. Marylynn Pearson from [facility]). But she was sent here to make sure there are no other issues before she has that done. Said she needs an OV here soon as she can get in.  She also would like to know the results of her EGD.

## 2014-09-26 NOTE — Telephone Encounter (Signed)
LMOM to call.

## 2014-09-26 NOTE — Telephone Encounter (Addendum)
PLEASE CALL PT. HER nausea and vomiting is most likely due to reflux, AND H PYLORI gastritis, and duodenitis.   She has an allergy to PCN AND AMOXICILLIN. SO she needs PYLERA 3 PILLS QID FOR 10 DAYS. She should take OMEPRAZOLE BID. The meds can cause nausea, vomiting, abd cramps, loose stools, black colored stools, and metallic taste in her mouth. TAKE ALL THE ABX AS PRESCRIBED. H PYLORI IF LEFT UNTREATED CAN LEAD TO STOMACH CANCER.  FOLLOW A LOW FAT DIET.   CUT DOWN ON YOUR SMOKING.  She doesn't need to follow up any sooner than MAY 2016 E30 GASTRITIS, NAUSEA/VOMTIING.

## 2014-09-26 NOTE — Telephone Encounter (Signed)
PATIENT CALLED REGARDING ENDO SHE HAD DONE, NOT GOTTEN RESULTS YET.  ALSO SCHEDULED FOR FU IN MAY AND STATING THAT HER SURGEON WANTS HER SEEN HERE SOONER    PLEASE ADVISE

## 2014-09-27 NOTE — Telephone Encounter (Signed)
Pt is aware and will come by the office tomorrow before 12:00 noon to pick up Pylera samples. [facility] called and said they would cost her over $700.00.

## 2014-09-27 NOTE — Telephone Encounter (Signed)
Patient returned your call.  Routing to [employer]

## 2014-12-25 NOTE — Telephone Encounter (Signed)
REVIEWED-NO ADDITIONAL RECOMMENDATIONS. 

## 2014-12-25 NOTE — Telephone Encounter (Signed)
Clearing out drawer at front and this pt had not picked up her Pylera.  I called her and she said she forgot because she has been so busy with her elderly father. Said she will try to get by in the next day or so and pick the Pylera up. She has an appt with Neil Crouch, PA later this month.

## 2014-12-25 NOTE — Telephone Encounter (Signed)
noted 

## 2015-01-10 ENCOUNTER — Ambulatory Visit (INDEPENDENT_AMBULATORY_CARE_PROVIDER_SITE_OTHER): Payer: Medicare Other | Admitting: Gastroenterology

## 2015-01-10 ENCOUNTER — Encounter: Payer: Self-pay | Admitting: Gastroenterology

## 2015-01-10 VITALS — BP 120/76 | HR 73 | Temp 98.4°F | Ht 66.0 in | Wt 171.4 lb

## 2015-01-10 DIAGNOSIS — R1013 Epigastric pain: Secondary | ICD-10-CM

## 2015-01-10 DIAGNOSIS — B9681 Helicobacter pylori [H. pylori] as the cause of diseases classified elsewhere: Secondary | ICD-10-CM | POA: Diagnosis not present

## 2015-01-10 DIAGNOSIS — K2970 Gastritis, unspecified, without bleeding: Secondary | ICD-10-CM

## 2015-01-10 MED ORDER — HYDROCODONE-ACETAMINOPHEN 5-325 MG PO TABS: 1.0000 | ORAL_TABLET | Freq: Four times a day (QID) | ORAL | Status: DC | PRN

## 2015-01-10 NOTE — Assessment & Plan Note (Signed)
Ongoing epigastric pain. Worse the last couple of weeks. Previous work up showed H.pylori gastritis. Patient never started Pylera treatment. Symptoms out of proportion for gastritis. Consider other etiologies such as biliary. Doubt cardiac but recommend she discuss with PCP. If another episode associated with chest pain, diaphoresis, recommend ED evaluation.   Start Pylera. Continue omeprazole BID. STAT labs. Abdominal u/s.

## 2015-01-10 NOTE — Progress Notes (Signed)
Primary Care Physician: [name], MD  Primary Gastroenterologist:  Barney Drain, MD   Chief Complaint  Patient presents with  . Abdominal Pain    HPI: Natasha Massey is a 49 y.o. female here for follow-up. She was seen back in December 2015 for further evaluation of abdominal pain and nausea and vomiting. At the time she was considering a hysterectomy for chronic pelvic pain that her GYN had recommended GI evaluation first. History of left ovarian complex cyst measuring 2.6 cm with septations, small right ovarian cyst measuring 14 mm on ultrasound. Follow-up ultrasound showed resolution. She underwent a CT abdomen pelvis with and without contrast for abdominal pain, left lower quadrant no acute findings. She had possible atelectasis versus pneumonia and subsequently was treated for pneumonia by her PCP with antibiotic therapy and prednisone therapy. She is a smoker.  Due to ongoing vomiting, abdominal pain patient did undergo an EGD back in January 2016. She had a small hiatal hernia, moderate nonerosive erythema and edema in the antrum. Mild erythema and edema in the duodenal bulb. Biopsy showed peptic duodenitis, chronic active gastritis with H. pylori. Patient was prescribed Pylera due to her amoxicillin/penicillin allergy. She could not afford the medications so we provided samples, however as of 12/25/2014 she never came to pick him up.   Last couple of weeks, epigastric pain has been constant. Not affected by meals. Associated with nausea but no vomiting. Shooting/squeezing pain into left side and into back. Pain severe, doubles her over. Hard to take a deep breath when it happens. Happens at rest. Wakes her up from deep sleep. Sometimes gets sweaty with it.  BM regular. No melena, brbpr. Omeprazole BID. Stopped ibuprofen couple of months ago.   Never had hysterectomy.     Current Outpatient Prescriptions  Medication Sig Dispense Refill  . diazepam (VALIUM) 5 MG  tablet Take 5 mg by mouth 4 (four) times daily.    Marland Kitchen gabapentin (NEURONTIN) 100 MG capsule Take 200 mg by mouth 2 (two) times daily.    Marland Kitchen omeprazole (PRILOSEC) 20 MG capsule 1 po 30 mins prior to breakfast and supper 60 capsule 11  . ondansetron (ZOFRAN) 4 MG tablet Take 1 tablet (4 mg total) by mouth every 8 (eight) hours as needed for nausea or vomiting. 20 tablet 0  . PROAIR HFA 108 (90 BASE) MCG/ACT inhaler Inhale 2 puffs into the lungs every 4 (four) hours as needed.  5  . zolpidem (AMBIEN) 5 MG tablet Take 5 mg by mouth at bedtime as needed for sleep.     No current facility-administered medications for this visit.    Allergies as of 01/10/2015 - Review Complete 01/10/2015  Allergen Reaction Noted  . Amoxicillin Hives 07/30/2014  . Aspirin Hives 02/03/2008  . Betadine [povidone iodine]  09/04/2014  . Penicillins Swelling 07/30/2014    ROS:  General: Negative for anorexia, weight loss, fever, chills, fatigue, weakness. ENT: Negative for hoarseness, difficulty swallowing , nasal congestion. CV: Negative for chest pain, angina, palpitations, dyspnea on exertion, peripheral edema.  Respiratory: Negative for dyspnea at rest, dyspnea on exertion, cough, sputum, wheezing.  GI: See history of present illness. GU:  Negative for dysuria, hematuria, urinary incontinence, urinary frequency, nocturnal urination.  Endo: Negative for unusual weight change.    Physical Examination:   BP 120/76 mmHg  Pulse 73  Temp(Src) 98.4 F (36.9 C) (Oral)  Ht 5\' 6"  (1.676 m)  Wt 171 lb 6.4 oz (77.747 kg)  BMI 27.68  kg/m2  General: Well-nourished, well-developed in no acute distress. Patient tearful. Eyes: No icterus. Mouth: Oropharyngeal mucosa moist and pink , no lesions erythema or exudate. Lungs: Clear to auscultation bilaterally.  Heart: Regular rate and rhythm, no murmurs rubs or gallops.  Abdomen: Bowel sounds are normal, moderate epigastric tenderness, nondistended, no hepatosplenomegaly  or masses, no abdominal bruits or hernia , no rebound or guarding.   Extremities: No lower extremity edema. No clubbing or deformities. Neuro: Alert and oriented x 4   Skin: Warm and dry, no jaundice.   Psych: Alert and cooperative, normal mood and affect.  Labs:  Lab Results  Component Value Date   WBC 8.1 09/05/2014   HGB 13.8 09/05/2014   HCT 41.0 09/05/2014   MCV 91.5 09/05/2014   PLT 362 09/05/2014   Lab Results  Component Value Date   CREATININE 0.68 09/05/2014   BUN 11 09/05/2014   NA 137 09/05/2014   K 4.3 09/05/2014   CL 106 09/05/2014   CO2 24 09/05/2014   Lab Results  Component Value Date   ALT 11 07/30/2014   AST 13 07/30/2014   ALKPHOS 66 07/30/2014   BILITOT 0.3 07/30/2014     Imaging Studies: No results found.

## 2015-01-10 NOTE — Patient Instructions (Signed)
1. Please have your labs done today. 2. Ultrasound as ordered. 3. Start your Pylera, three capsules four times daily for 10 days. You must continue your omeprazole 20mg  twice daily for it to be effective. 4. Please call your PCP today and discuss whether you should see a cardiologist or have EKG with them. Doubt heart etiology but since your pain radiates into the left chest you should consider.  5. If you have another severe episode of pain, consider ER evaluation. 6. Vicodin as needed for moderate to severe pain only.

## 2015-01-11 ENCOUNTER — Ambulatory Visit (HOSPITAL_COMMUNITY): Admission: RE | Admit: 2015-01-11 | Payer: Medicare Other | Source: Ambulatory Visit

## 2015-01-14 ENCOUNTER — Telehealth: Payer: Self-pay | Admitting: General Practice

## 2015-01-14 ENCOUNTER — Ambulatory Visit (HOSPITAL_COMMUNITY)
Admission: RE | Admit: 2015-01-14 | Discharge: 2015-01-14 | Disposition: A | Discharge status: Home / Self Care | Payer: Medicare Other | Source: Ambulatory Visit | Attending: Gastroenterology | Admitting: Gastroenterology

## 2015-01-14 DIAGNOSIS — K838 Other specified diseases of biliary tract: Secondary | ICD-10-CM | POA: Insufficient documentation

## 2015-01-14 DIAGNOSIS — K7689 Other specified diseases of liver: Secondary | ICD-10-CM | POA: Diagnosis not present

## 2015-01-14 DIAGNOSIS — Q6102 Congenital multiple renal cysts: Secondary | ICD-10-CM | POA: Insufficient documentation

## 2015-01-14 DIAGNOSIS — Z8719 Personal history of other diseases of the digestive system: Secondary | ICD-10-CM | POA: Diagnosis not present

## 2015-01-14 DIAGNOSIS — B9681 Helicobacter pylori [H. pylori] as the cause of diseases classified elsewhere: Secondary | ICD-10-CM

## 2015-01-14 DIAGNOSIS — R1013 Epigastric pain: Secondary | ICD-10-CM | POA: Insufficient documentation

## 2015-01-14 DIAGNOSIS — K2970 Gastritis, unspecified, without bleeding: Secondary | ICD-10-CM

## 2015-01-14 NOTE — Telephone Encounter (Signed)
FYI  I received a call from [facility], U/S tech at [facility] stating the wrong U/S report was scanned in for the above patient, however they will be deleting that report and the correct report will be scanned into Epic.  Routing to [place].

## 2015-01-15 NOTE — Telephone Encounter (Signed)
Noted  

## 2015-01-16 ENCOUNTER — Telehealth: Payer: Self-pay | Admitting: Gastroenterology

## 2015-01-16 NOTE — Telephone Encounter (Signed)
Patient had called earlier today asking about her U/S results. She had U/S done on Monday this week and I told her that the result probably wouldn't be here this soon that normally it's 7-10 business days, but that I would let the nurse be aware that she had called. She can be reached at [phone]

## 2015-01-16 NOTE — Progress Notes (Signed)
cc'ed to pcp °

## 2015-01-17 NOTE — Telephone Encounter (Signed)
I called pt to let her know that we should call within 7-10 business days with the results. The [place] was done on 01/14/2015. She said everything is about the same, she was just wondering how it turned out. She is fine to wait for results.

## 2015-01-21 NOTE — Progress Notes (Signed)
Quick Note:  Please let patient know her u/s ok overall. She has slight dilation of her common bile duct for her age, unclear if this is significant but no stones or other findings to explain. Nothing on CT in 07/2014 to explain CBD dilation.   Recommend she complete her Pylera as recommended. I need her to get her labs done.  I will discuss CBD dilation with Dr. Gala Romney.   ______

## 2015-01-22 NOTE — Progress Notes (Signed)
Quick Note:  LM to call. ______ 

## 2015-01-22 NOTE — Progress Notes (Signed)
Quick Note:  Pt is aware. She said since she is on the antibiotics she has thrush and would like to know if she can have some of the medication to swish, and [facility] [place]. Please advise! ______

## 2015-01-23 ENCOUNTER — Other Ambulatory Visit: Payer: Self-pay | Admitting: Gastroenterology

## 2015-01-23 MED ORDER — NYSTATIN 100000 UNIT/ML MT SUSP: 5.0000 mL | Freq: Four times a day (QID) | OROMUCOSAL | Status: DC

## 2015-01-23 NOTE — Progress Notes (Signed)
Quick Note:  Nystatin swish and swallow QID for 10-14 days. RX sent. Please let patient know. ______

## 2015-01-24 NOTE — Telephone Encounter (Signed)
Please let patient know. Also refer to result note to Abd u/s. I could not attach this notation to it for some reason?  Yes, I spoke with SLF and I have review her abd u/s with radiologist, Dr. Thornton Papas.  Her common bile duct is 9.40mm (normal 4-78mm) on U/S and on CT last year it was 2mm. No stones seen. Nothing really to explain.   -We still need her labs because based on results she might need a MRCP to look at duct more closely. -Dr. Oneida Alar also want to make sure patient completes the Pylera.  -Let's have patient come back in 2 months to see SLF.

## 2015-01-24 NOTE — Progress Notes (Signed)
Quick Note:  Yes, I spoke with SLF and I have review her abd u/s with radiologist, Dr. Thornton Papas.  Her common bile duct is 9.75mm (normal 4-60mm) on U/S and on CT last year it was 24mm. No stones seen. Nothing really to explain.   -We still need her labs because based on results she might need a MRCP to look at duct more closely. -Dr. Oneida Alar also want to make sure patient completes the Pylera.  -Let's have patient come back in 2 months to see SLF. ______

## 2015-01-24 NOTE — Telephone Encounter (Signed)
Pt is aware. She will try to do the labs Monday, 01/28/2015. Routing to [place] to schedule the next OV.

## 2015-01-24 NOTE — Progress Notes (Signed)
Quick Note:  Yes, I spoke with SLF and I have review her abd u/s with radiologist, Dr. Thornton Papas.  Her common bile duct is 9.33mm (normal 4-10mm) on U/S and on CT last year it was 58mm. No stones seen. Nothing really to explain.   -We still need her labs because based on results she might need a MRCP to look at duct more closely. -Dr. Oneida Alar also want to make sure patient completes the Pylera.  -Let's have patient come back in 2 months to see SLF. ______

## 2015-01-28 ENCOUNTER — Telehealth: Payer: Self-pay | Admitting: Gastroenterology

## 2015-01-28 NOTE — Telephone Encounter (Signed)
Reminder in epic to follow up with SF in 2 months

## 2015-01-28 NOTE — Progress Notes (Signed)
Quick Note:  I talked to pt this morning and told her that she needs to complete the labs so we will know if the MRCP is necessary. She said she will try to go right away. ______

## 2015-01-28 NOTE — Telephone Encounter (Signed)
I called pt and told her we need the labs ( CBC, CMP and Lipase that was ordered on 01/10/2015) and need the results to determine if the MRCP is needed. She will try to do those right away.  She is requesting pain med ( Was given Hydrocodone , a few at [facility].)  She said she has intermittent abdominal pain with tightness and squeezing and she will have the episodes a few times each day. Sometimes they last for a couple of hours, sometimes 30 min and sometimes just 5 min.  She is aware that Neil Crouch, PA has left for the day and it will be tomorrow before I hear back from her.  She is OK with that.

## 2015-01-28 NOTE — Telephone Encounter (Signed)
Patient called this morning asking if she could do her labs and schedule her MRCP all at the same time. She was wanting to do it all at once. I told her that I would have to ask and someone would get back with her. Then she wanted to know if she could get something for pain that she was still hurting.I have put her on the recall list to follow up with SF in 2 months. Please advise. [phone]

## 2015-01-29 LAB — COMPREHENSIVE METABOLIC PANEL
ALK PHOS: 61 U/L (ref 39–117)
ALT: 20 U/L (ref 0–35)
AST: 19 U/L (ref 0–37)
Albumin: 4.2 g/dL (ref 3.5–5.2)
BUN: 11 mg/dL (ref 6–23)
CALCIUM: 9.5 mg/dL (ref 8.4–10.5)
CO2: 27 meq/L (ref 19–32)
Chloride: 103 mEq/L (ref 96–112)
Creat: 0.66 mg/dL (ref 0.50–1.10)
GLUCOSE: 83 mg/dL (ref 70–99)
POTASSIUM: 4.4 meq/L (ref 3.5–5.3)
SODIUM: 137 meq/L (ref 135–145)
Total Bilirubin: 0.5 mg/dL (ref 0.2–1.2)
Total Protein: 6.8 g/dL (ref 6.0–8.3)

## 2015-01-29 LAB — CBC WITH DIFFERENTIAL/PLATELET
Basophils Absolute: 0.1 10*3/uL (ref 0.0–0.1)
Basophils Relative: 1 % (ref 0–1)
EOS PCT: 2 % (ref 0–5)
Eosinophils Absolute: 0.2 10*3/uL (ref 0.0–0.7)
HCT: 43.2 % (ref 36.0–46.0)
HEMOGLOBIN: 14.8 g/dL (ref 12.0–15.0)
Lymphocytes Relative: 41 % (ref 12–46)
Lymphs Abs: 3.3 10*3/uL (ref 0.7–4.0)
MCH: 30.1 pg (ref 26.0–34.0)
MCHC: 34.3 g/dL (ref 30.0–36.0)
MCV: 88 fL (ref 78.0–100.0)
MONO ABS: 0.5 10*3/uL (ref 0.1–1.0)
MPV: 9.2 fL (ref 8.6–12.4)
Monocytes Relative: 6 % (ref 3–12)
Neutro Abs: 4 10*3/uL (ref 1.7–7.7)
Neutrophils Relative %: 50 % (ref 43–77)
Platelets: 372 10*3/uL (ref 150–400)
RBC: 4.91 MIL/uL (ref 3.87–5.11)
RDW: 14.2 % (ref 11.5–15.5)
WBC: 8 10*3/uL (ref 4.0–10.5)

## 2015-01-29 LAB — LIPASE: LIPASE: 165 U/L — AB (ref 0–75)

## 2015-01-29 MED ORDER — HYDROCODONE-ACETAMINOPHEN 5-325 MG PO TABS: 1.0000 | ORAL_TABLET | Freq: Four times a day (QID) | ORAL | Status: DC | PRN

## 2015-01-29 NOTE — Addendum Note (Signed)
Addended by: Mahala Menghini on: 01/29/2015 08:13 AM   Modules accepted: Orders

## 2015-01-29 NOTE — Telephone Encounter (Signed)
One time RX for pain med. She really needs to follow our recommendations in a timely fashion so we can try and help her.  Has she completed the Pylera?

## 2015-01-29 NOTE — Telephone Encounter (Signed)
Pt is aware and she will be by and pick up the order for the lab and the prescription.

## 2015-01-30 NOTE — Progress Notes (Signed)
Quick Note:  Labs okay except lipase up some. With h/o dilated CBD, elevated lipase, and ongoing epigastric pain, lets proceed with MRI Abd/MRCP w/wo contrast.  Consider backing down to clear liquids for two days to see if helps abdominal pain, if she has pancreatitis this would be recommended.   No etoh. I don't think she drinks according to EPIC. ______

## 2015-01-31 ENCOUNTER — Other Ambulatory Visit: Payer: Self-pay | Admitting: Gastroenterology

## 2015-01-31 ENCOUNTER — Other Ambulatory Visit: Payer: Self-pay

## 2015-01-31 DIAGNOSIS — K838 Other specified diseases of biliary tract: Secondary | ICD-10-CM

## 2015-01-31 DIAGNOSIS — R1013 Epigastric pain: Secondary | ICD-10-CM

## 2015-01-31 NOTE — Telephone Encounter (Signed)
LM for pt to call

## 2015-01-31 NOTE — Telephone Encounter (Signed)
PT is aware.

## 2015-01-31 NOTE — Progress Notes (Signed)
Quick Note:  Pt is aware and OK to schedule. ______

## 2015-01-31 NOTE — Telephone Encounter (Signed)
Patient called back and stated she had one more question for Doris.    Routing to [employer].

## 2015-01-31 NOTE — Telephone Encounter (Signed)
Miralax one capful up to three times today, then may take daily as needed.

## 2015-01-31 NOTE — Progress Notes (Signed)
Opened in error

## 2015-01-31 NOTE — Telephone Encounter (Signed)
Pt said she has not had a BM in 5 days and wants to know what she should do for it. She had just been told to do liquids for a couple of days ( see result note). Please advise!

## 2015-02-04 ENCOUNTER — Other Ambulatory Visit (HOSPITAL_COMMUNITY): Payer: Medicare Other

## 2015-02-15 ENCOUNTER — Other Ambulatory Visit: Payer: Self-pay | Admitting: Gastroenterology

## 2015-02-15 ENCOUNTER — Ambulatory Visit (HOSPITAL_COMMUNITY)
Admission: RE | Admit: 2015-02-15 | Discharge: 2015-02-15 | Disposition: A | Discharge status: Home / Self Care | Payer: Medicare Other | Source: Ambulatory Visit | Attending: Gastroenterology | Admitting: Gastroenterology

## 2015-02-15 DIAGNOSIS — R1013 Epigastric pain: Secondary | ICD-10-CM | POA: Diagnosis present

## 2015-02-15 DIAGNOSIS — N281 Cyst of kidney, acquired: Secondary | ICD-10-CM | POA: Diagnosis not present

## 2015-02-15 DIAGNOSIS — K7689 Other specified diseases of liver: Secondary | ICD-10-CM | POA: Insufficient documentation

## 2015-02-15 DIAGNOSIS — K838 Other specified diseases of biliary tract: Secondary | ICD-10-CM | POA: Insufficient documentation

## 2015-02-15 MED ORDER — GADOBENATE DIMEGLUMINE 529 MG/ML IV SOLN
14.0000 mL | Freq: Once | INTRAVENOUS | Status: AC | PRN
Start: 2015-02-15 — End: 2015-02-15
  Administered 2015-02-15: 14 mL via INTRAVENOUS

## 2015-02-15 MED ORDER — SODIUM CHLORIDE 0.9 % IV SOLN
INTRAVENOUS | Status: AC
  Filled 2015-02-15: qty 50

## 2015-02-15 MED ORDER — SODIUM CHLORIDE 0.9 % IJ SOLN
INTRAMUSCULAR | Status: AC
  Filled 2015-02-15: qty 9

## 2015-02-15 NOTE — Progress Notes (Signed)
Quick Note:  To discuss findings with Dr. Oneida Alar. ______

## 2015-02-19 ENCOUNTER — Telehealth: Payer: Self-pay

## 2015-02-19 ENCOUNTER — Other Ambulatory Visit: Payer: Self-pay

## 2015-02-19 DIAGNOSIS — K838 Other specified diseases of biliary tract: Secondary | ICD-10-CM

## 2015-02-19 DIAGNOSIS — R748 Abnormal levels of other serum enzymes: Secondary | ICD-10-CM

## 2015-02-19 DIAGNOSIS — R109 Unspecified abdominal pain: Secondary | ICD-10-CM

## 2015-02-19 NOTE — Telephone Encounter (Signed)
Noted and pt is aware.

## 2015-02-19 NOTE — Progress Notes (Signed)
Quick Note:  Please let patient know that she has some dilation of the bile duct coming from her liver (common hepatic and common bile). May be a normal variation (the way she was born and has no significance) but cannot exclude stricturing of distal CBD or choledochal cyst (cyst inside bile duct).   I have discussed with Dr. Oneida Alar. She may need a procedure to determine (ERCP) but she wants patient to be evaluated at [facility] to determine.   Please schedule GI consultation at [facility] (reason for consult: abd pain, elevated lipase, biliary dilation on imaging. Send copy of CT A/P, abd u/s, MRCP. ______

## 2015-02-19 NOTE — Telephone Encounter (Signed)
Pt called and was wanting her MRI results. States she doesn't like having to wait for her results. States she is a patient too and she needs to know what is going on with her body.  Please advise

## 2015-02-19 NOTE — Telephone Encounter (Signed)
See result note. Please notify patient of results.

## 2015-02-27 ENCOUNTER — Encounter: Payer: Self-pay | Admitting: Gastroenterology

## 2015-02-27 ENCOUNTER — Other Ambulatory Visit: Payer: Self-pay

## 2015-02-27 NOTE — Telephone Encounter (Signed)
Pt is calling for a refill on her pain medication

## 2015-03-13 ENCOUNTER — Encounter: Payer: Self-pay | Admitting: Cardiology

## 2015-03-13 ENCOUNTER — Ambulatory Visit (INDEPENDENT_AMBULATORY_CARE_PROVIDER_SITE_OTHER): Payer: Medicare Other | Admitting: Cardiology

## 2015-03-13 VITALS — BP 122/76 | HR 63 | Ht 66.0 in | Wt 176.0 lb

## 2015-03-13 DIAGNOSIS — R072 Precordial pain: Secondary | ICD-10-CM | POA: Insufficient documentation

## 2015-03-13 DIAGNOSIS — R079 Chest pain, unspecified: Secondary | ICD-10-CM

## 2015-03-13 NOTE — Patient Instructions (Signed)
Medication Instructions:  Your physician recommends that you continue on your current medications as directed. Please refer to the Current Medication list given to you today.  Testing/Procedures: Your physician has requested that you have an exercise tolerance test. For further information please visit [email or web address]. Please also follow instruction sheet, as given.  Follow-Up: As needed after treadmill testing.  Thank you for choosing [facility]!!

## 2015-03-13 NOTE — Progress Notes (Signed)
Cardiology Office Note   Date:  03/13/2015   ID:  Natasha Massey, DOB May 07, 1966, MRN [id number]  PCP:  [name], MD  Cardiologist:   Minus Breeding, MD   Chief Complaint  Patient presents with  . Chest Pain      History of Present Illness: Natasha Massey is a 49 y.o. female who presents for evaluation of chest discomfort. She has no past cardiac history. She is being evaluated for cholecystectomy and some chronic abdominal complaints. She reports a mass between her gallbladder and liver but I don't have these records. She says she was referred by her gastroenterologist prior to probable surgery. She's had for about 2 years she's been having chest discomfort. It's increasing in frequency and intensity. She says she'll get abdominal discomfort and then she'll get radiation of her chest, jaw and teeth.  However, this doesn't always happen just with the abdominal pain. He can happen on its own. It does not happen necessarily with exertion. It happens at rest. It might last for a day or an hour.  She says she'll get nauseated. She might have sweats. She doesn't describe excessive shortness of breath, PND or orthopnea. She did report an episode of syncope about 6 months ago.  She does describe some palpitations. She says her heart rate is always very fast although today it is quite normal.  Past Medical History  Diagnosis Date  . COPD (chronic obstructive pulmonary disease)   . Pneumonia   . Anxiety   . GERD (gastroesophageal reflux disease)   . Neuropathy     Past Surgical History  Procedure Laterality Date  . Tubal ligation    . Endometrial ablation    . Back surgery  2001    lumbar disc and cage  . Esophagogastroduodenoscopy (egd) with propofol N/A 09/11/2014    [facility]: Normal esophageal mucosa. 2. Small hiatal hernia. 3. Moderate nonerosive erthyema and edema in the antraum. col forceps biopsies taken. 4. Mild erthyema and edema in teh duodenal bulb. Normal second  portion of the duodenum. cold fordceps biopsies obtained to evaluate for eosinophilic gastritis. 5. Nausea and vomiting most likely due to reflux, gastrittis, and duodenitis.   . Esophageal biopsy N/A 09/11/2014    Procedure: DUODENAL AND GASTRIC BIOPSIES;  Surgeon: Danie Binder, MD;  Location: [facility];  Service: Endoscopy;  Laterality: N/A;     Current Outpatient Prescriptions  Medication Sig Dispense Refill  . diazepam (VALIUM) 5 MG tablet Take 5 mg by mouth 4 (four) times daily.    Marland Kitchen gabapentin (NEURONTIN) 100 MG capsule Take 200 mg by mouth 2 (two) times daily.    Marland Kitchen omeprazole (PRILOSEC) 20 MG capsule 1 po 30 mins prior to breakfast and supper 60 capsule 11  . ondansetron (ZOFRAN) 4 MG tablet Take 1 tablet (4 mg total) by mouth every 8 (eight) hours as needed for nausea or vomiting. 20 tablet 0  . PROAIR HFA 108 (90 BASE) MCG/ACT inhaler Inhale 2 puffs into the lungs every 4 (four) hours as needed.  5  . zolpidem (AMBIEN) 5 MG tablet Take 5 mg by mouth at bedtime as needed for sleep.     No current facility-administered medications for this visit.    Allergies:   Amoxicillin; Aspirin; Betadine; and Penicillins    Social History:  The patient  reports that she has been smoking Cigarettes.  She has a 17.5 pack-year smoking history. She has never used smokeless tobacco. She reports that she does not  drink alcohol or use illicit drugs.   Family History:  The patient's family history includes Breast cancer in her other; Heart attack (age of onset: 35) in her maternal grandmother; Inflammatory bowel disease in an other family member; Lung cancer in her sister; Lung cancer (age of onset: 48) in her brother; Renal cancer in her father. There is no history of Colon cancer.    ROS:  Please see the history of present illness.   Otherwise, review of systems are positive for reflux, edema, leg cramps All other systems are reviewed and negative.    PHYSICAL EXAM: VS:  BP 122/76 mmHg  Pulse 63   Ht 5\' 6"  (1.676 m)  Wt 176 lb (79.833 kg)  BMI 28.42 kg/m2 , BMI Body mass index is 28.42 kg/(m^2). GENERAL:  Well appearing HEENT:  Pupils equal round and reactive, fundi not visualized, oral mucosa unremarkable NECK:  No jugular venous distention, waveform within normal limits, carotid upstroke brisk and symmetric, no bruits, no thyromegaly LYMPHATICS:  No cervical, inguinal adenopathy LUNGS:  Clear to auscultation bilaterally BACK:  No CVA tenderness CHEST:  Unremarkable HEART:  PMI not displaced or sustained,S1 and S2 within normal limits, no S3, no S4, no clicks, no rubs, no murmurs ABD:  Flat, positive bowel sounds normal in frequency in pitch, no bruits, no rebound, no guarding, no midline pulsatile mass, no hepatomegaly, no splenomegaly EXT:  2 plus pulses throughout, no edema, no cyanosis no clubbing SKIN:  No rashes no nodules NEURO:  Cranial nerves II through XII grossly intact, motor grossly intact throughout PSYCH:  Cognitively intact, oriented to person place and time    EKG:  EKG is not ordered today. The ekg ordered today demonstrates Sinus rhythm, rate 63, axis within normal limits, intervals within normal limits, no acute ST-T wave changes.    Recent Labs: 01/29/2015: ALT 20; BUN 11; Creat 0.66; Hemoglobin 14.8; Platelets 372; Potassium 4.4; Sodium 137    Lipid Panel No results found for: CHOL, TRIG, HDL, CHOLHDL, VLDL, LDLCALC, LDLDIRECT    Wt Readings from Last 3 Encounters:  03/13/15 176 lb (79.833 kg)  02/15/15 170 lb (77.111 kg)  02/15/15 170 lb (77.111 kg)      Other studies Reviewed: Additional studies/ records that were reviewed today include: Primary MD records. Review of the above records demonstrates:  Please see elsewhere in the note.     ASSESSMENT AND PLAN:  CHEST PAIN: Her pain is atypical for cardiac pain but she does have risk factors. I think the pretest probability of obstructive coronary disease is somewhat low but I would like to  screen her with a treadmill test. I will bring the patient back for a POET (Plain Old Exercise Test). This will allow me to screen for obstructive coronary disease, risk stratify and very importantly provide a prescription for exercise.  PALPITATIONS:  She describes palpitations or rapid heart rate which will be assessed at the time of her stress test. Today her heart rate is quite normal and I don't think an event monitor is indicated.  TOBACCO:  We discussed a specific strategy for tobacco cessation.  (Greater than three minutes discussing tobacco cessation.)   Current medicines are reviewed at length with the patient today.  The patient does not have concerns regarding medicines.  The following changes have been made:  no change  Labs/ tests ordered today include:   Orders Placed This Encounter  Procedures  . Exercise Tolerance Test  . EKG 12-Lead  Disposition:   FU with me as needed.     Signed, Minus Breeding, MD  03/13/2015 2:20 PM    [facility]

## 2015-03-21 ENCOUNTER — Ambulatory Visit (HOSPITAL_COMMUNITY)
Admission: RE | Admit: 2015-03-21 | Discharge: 2015-03-21 | Disposition: A | Discharge status: Home / Self Care | Payer: Medicare Other | Source: Ambulatory Visit | Attending: Cardiology | Admitting: Cardiology

## 2015-03-21 DIAGNOSIS — R079 Chest pain, unspecified: Secondary | ICD-10-CM | POA: Diagnosis present

## 2015-03-21 LAB — EXERCISE TOLERANCE TEST
CSEPEDS: 9 s
Estimated workload: 7.4 METS
Exercise duration (min): 6 min
MPHR: 172 {beats}/min
Peak HR: 120 {beats}/min
Percent HR: 69 %
RPE: 15
Rest HR: 76 {beats}/min

## 2015-03-25 ENCOUNTER — Other Ambulatory Visit: Payer: Self-pay | Admitting: *Deleted

## 2015-03-25 DIAGNOSIS — R079 Chest pain, unspecified: Secondary | ICD-10-CM

## 2015-03-28 ENCOUNTER — Telehealth (HOSPITAL_COMMUNITY): Payer: Self-pay

## 2015-03-28 NOTE — Telephone Encounter (Signed)
Encounter complete. 

## 2015-03-29 ENCOUNTER — Telehealth (HOSPITAL_COMMUNITY): Payer: Self-pay

## 2015-03-29 NOTE — Telephone Encounter (Signed)
Encounter complete. 

## 2015-04-02 ENCOUNTER — Ambulatory Visit (HOSPITAL_COMMUNITY)
Admission: RE | Admit: 2015-04-02 | Discharge: 2015-04-02 | Disposition: A | Discharge status: Home / Self Care | Payer: Medicare Other | Source: Ambulatory Visit | Attending: Urology | Admitting: Urology

## 2015-04-02 DIAGNOSIS — R079 Chest pain, unspecified: Secondary | ICD-10-CM | POA: Diagnosis present

## 2015-04-02 LAB — MYOCARDIAL PERFUSION IMAGING
CHL CUP RESTING HR STRESS: 70 {beats}/min
LVDIAVOL: 104 mL
LVSYSVOL: 48 mL
Peak HR: 105 {beats}/min
SDS: 0
SRS: 4
SSS: 4
TID: 1.17

## 2015-04-02 MED ORDER — TECHNETIUM TC 99M SESTAMIBI GENERIC - CARDIOLITE
31.0000 | Freq: Once | INTRAVENOUS | Status: AC | PRN
  Administered 2015-04-02: 31 via INTRAVENOUS

## 2015-04-02 MED ORDER — TECHNETIUM TC 99M SESTAMIBI GENERIC - CARDIOLITE
10.5000 | Freq: Once | INTRAVENOUS | Status: AC | PRN
Start: 2015-04-02 — End: 2015-04-02
  Administered 2015-04-02: 11 via INTRAVENOUS

## 2015-04-02 MED ORDER — REGADENOSON 0.4 MG/5ML IV SOLN
0.4000 mg | Freq: Once | INTRAVENOUS | Status: AC
  Administered 2015-04-02: 0.4 mg via INTRAVENOUS

## 2015-04-02 MED ORDER — AMINOPHYLLINE 25 MG/ML IV SOLN
75.0000 mg | Freq: Once | INTRAVENOUS | Status: AC
Start: 2015-04-02 — End: 2015-04-02
  Administered 2015-04-02: 75 mg via INTRAVENOUS

## 2015-04-04 ENCOUNTER — Telehealth: Payer: Self-pay | Admitting: Cardiology

## 2015-04-04 NOTE — Telephone Encounter (Signed)
Results given to patient - she voiced thanks and understanding.  Routed results of stress test to PCP as Dr. Percival Spanish requested.

## 2015-04-04 NOTE — Telephone Encounter (Signed)
Patient not available - other family member answered phone. Requested pt call back.

## 2015-04-04 NOTE — Telephone Encounter (Signed)
New message ° ° ° ° °Want stress test results °

## 2015-04-17 ENCOUNTER — Ambulatory Visit: Payer: Medicare Other | Admitting: Gastroenterology

## 2015-05-27 ENCOUNTER — Telehealth: Payer: Self-pay | Admitting: Gastroenterology

## 2015-05-27 NOTE — Telephone Encounter (Signed)
Following up on Natasha Massey records. Patient had EUS to follow up on abnormal bile duct seen on imaging studies.  EUS unremarkable.  Patient is due follow up with [facility] only at any time (ruq pain, biliary dilation/elevated lipase/eus at [facility]). Please make appt.

## 2015-05-28 ENCOUNTER — Encounter: Payer: Self-pay | Admitting: Gastroenterology

## 2015-05-28 NOTE — Telephone Encounter (Signed)
APPT MADE AND LETTER SENT  °

## 2015-06-13 ENCOUNTER — Encounter: Payer: Self-pay | Admitting: Gastroenterology

## 2015-06-13 ENCOUNTER — Ambulatory Visit (INDEPENDENT_AMBULATORY_CARE_PROVIDER_SITE_OTHER): Payer: Medicare Other | Admitting: Gastroenterology

## 2015-06-13 ENCOUNTER — Other Ambulatory Visit: Payer: Self-pay

## 2015-06-13 VITALS — BP 121/79 | HR 84 | Temp 97.4°F | Ht 66.0 in | Wt 178.6 lb

## 2015-06-13 DIAGNOSIS — R110 Nausea: Secondary | ICD-10-CM

## 2015-06-13 DIAGNOSIS — R109 Unspecified abdominal pain: Secondary | ICD-10-CM

## 2015-06-13 DIAGNOSIS — K2970 Gastritis, unspecified, without bleeding: Secondary | ICD-10-CM

## 2015-06-13 DIAGNOSIS — R140 Abdominal distension (gaseous): Secondary | ICD-10-CM

## 2015-06-13 DIAGNOSIS — R1013 Epigastric pain: Secondary | ICD-10-CM | POA: Diagnosis not present

## 2015-06-13 MED ORDER — NORTRIPTYLINE HCL 10 MG PO CAPS: ORAL_CAPSULE | ORAL | Status: DC

## 2015-06-13 NOTE — Progress Notes (Signed)
cc'ed to pcp °

## 2015-06-13 NOTE — Addendum Note (Signed)
Addended by: Danie Binder on: 06/13/2015 11:46 AM   Modules accepted: Orders

## 2015-06-13 NOTE — Progress Notes (Signed)
ON RECALL  °

## 2015-06-13 NOTE — Progress Notes (Signed)
Subjective:    Patient ID: Natasha Massey, female    DOB: 05/11/1966, 49 y.o.   MRN: [id number]  Natasha Mustache, MD   HPI  EPIGASTRIC PAIN FOR 1 YEAR. STARTS IN EPIGASTRIUM AND BY TIME IT'S DONE ALL THROUGH STOMACH AND WRAPS AROUND. SYMPTOMS WORSE OVER THE LAST YEAR. CAN HANDLE THE PAIN BUT CAN LAST 4-5 DAYS AND SHE WILL GET TO THE POINT WHERE SHE CAN'T BEAR IT. WITH ABDOMINAL PAIN: NAUSEA, RARE VOMITING UNTIL IT'S GETS BAD. BETTER: ICE PACKS. TRIGGERS: NONE. MAY WAKE HER UP WITH THE PAIN(1-2X/WEEK). GAINING WEIGHT. POOR APPETITE. BMs: DAILY WHEN ABD PAIN GETS BAD L SIDE CHEST PAIN AND SOMETIMES BETWEEN HER SHOULDER BLADES. HEARTBURN: COUPLE TIMES A WEEK(MEDS MOST DAY, DON'T REALLY WORK). STILL SMOKING: < 1/2 PK/DAY WAS UP TO 1-1.5 PKS A DAY. NO ETOH, ASA. ALEVE BID-DOESN'T HELP ABDOMINAL PAIN. STRESS: ABDOMINAL PAIN. NO MILK, ICE CREAM, OR CHEESE. DENIES STRESS, ANXIETY, DEPRESSION, OR PHYSICAL/MENTAL/SEXUAL ABUSE. NEVER HAS LOWER ABDOMINAL PAIN.   PT DENIES FEVER, CHILLS, HEMATOCHEZIA, HEMATEMESIS,  melena, diarrhea, SHORTNESS OF BREATH,  CHANGE IN BOWEL IN HABITS, constipation,  problems swallowing, problems with sedation, OR heartburn or indigestion.  Past Medical History  Diagnosis Date  . COPD (chronic obstructive pulmonary disease)   . Pneumonia   . Anxiety   . GERD (gastroesophageal reflux disease)   . Neuropathy     Past Surgical History  Procedure Laterality Date  . Tubal ligation    . Endometrial ablation    . Back surgery  2001    lumbar disc and cage  . Esophagogastroduodenoscopy (egd) with propofol N/A 09/11/2014    H PYLORI GASTRITIS AND DUODENITIS   . Esophageal biopsy N/A 09/11/2014       Allergies  Allergen Reactions  . Amoxicillin Hives  . Aspirin Hives  . Betadine [Povidone Iodine]     Breaks out skin  . Penicillins Swelling   Current Outpatient Prescriptions  Medication Sig Dispense Refill  . diazepam (VALIUM) 5 MG tablet Take 5 mg by mouth 4  (four) times daily.    Marland Kitchen gabapentin (NEURONTIN) 100 MG capsule Take 200 mg by mouth 2 (two) times daily. NOT TAKING DUE TO DIZZINESS   . omeprazole (PRILOSEC) 20 MG capsule 1 po 30 mins prior to breakfast and supper    . ondansetron (ZOFRAN) 4 MG tablet Take 1 tablet (4 mg total) by mouth every 8 (eight) hours as needed for nausea or vomiting.    Marland Kitchen PROAIR HFA 108 (90 BASE) MCG/ACT inhaler Inhale 2 puffs into the lungs every 4 (four) hours as needed.    . zolpidem (AMBIEN) 5 MG tablet Take 5 mg by mouth at bedtime as needed for sleep.     Social History  Substance Use Topics  . Smoking status: Current Every Day Smoker -- 0.50 packs/day for 35 years    Types: Cigarettes  . Smokeless tobacco: Never Used  . Alcohol Use: No  COMMON LAW HUSBAND: 2 KIDS 26 AND 29. 3 GRANDKIDS.  Review of Systems PER HPI OTHERWISE ALL SYSTEMS ARE NEGATIVE.    Objective:   Physical Exam  Constitutional: She is oriented to person, place, and time. She appears well-developed and well-nourished. No distress.  MILD DISTRESS  HENT:  Head: Normocephalic and atraumatic.  Mouth/Throat: Oropharynx is clear and moist. No oropharyngeal exudate.  Eyes: Pupils are equal, round, and reactive to light. No scleral icterus.  Neck: Normal range of motion. Neck supple.  Cardiovascular: Normal rate, regular rhythm and normal  heart sounds.   Pulmonary/Chest: Effort normal and breath sounds normal. No respiratory distress.  Abdominal: Soft. Bowel sounds are normal. She exhibits no distension. There is tenderness. There is no rebound and no guarding.  MILD TTP IN THE EPIGASTRIUM  TO MODERATE IN LUQ AND EPIGASTRIUM  Musculoskeletal: She exhibits no edema.  Lymphadenopathy:    She has no cervical adenopathy.  Neurological: She is alert and oriented to person, place, and time.  NO FOCAL DEFICITS  Psychiatric:  FLAT AFFECT, SLIGHTLY ANXIOUS MOOD  Vitals reviewed.         Assessment & Plan:

## 2015-06-13 NOTE — Patient Instructions (Signed)
TO HELP WITH THE PAIN, ADD PAMELOR 10 MG AT BEDTIME FOR 7 DAYS THEN 2 PO AT BEDTIME FOR 7 DAYS THEN 3 PO AT BEDTIME. IT MAY CAUSE DROWSINESS, DRY EYES/MOUTH, BLURRY VISION, OR DIFFICULTY URINATING.PLEASE CALL WITH QUESTIONS OR CONCERNS.  COMPLETE HIDA SCAN. IF IT SHOWS A DISEASED GALLBLADDER, I WILL SEND YOU TO THE SURGEON.  STOP TAKING PRILOSEC FOR 2 WEEKS. THEN COMPLETE THE H PYLORI BREATH TEST.  HYDROGEN BREATH TEST TO EVALUATE FOR SMALL BOWL BACTERIAL OVERGROWTH IN 7- 10 DAYS TO WORKUP YOUR BLOATING.  FOLLOW UP IN 3 MOS.

## 2015-06-13 NOTE — Assessment & Plan Note (Signed)
SYMPTOMS NOT CONTROLLED.  DIFFERENTIAL DIAGNOSIS INCLUDES: BILIARY COLIC, SMALL INTESTINE BACTERIAL OVERGROWTH, H PYLORI GASTRITIS, non-ulcer dyspepsia  TO HELP WITH THE PAIN, ADD PAMELOR 10 MG AT BEDTIME FOR 7 DAYS THEN 2 PO AT BEDTIME FOR 7 DAYS THEN 3 PO AT BEDTIME. IT MAY CAUSE DROWSINESS, DRY EYES/MOUTH, BLURRY VISION, OR DIFFICULTY URINATING. CALL WITH QUESTIONS OR CONCERNS.  COMPLETE HIDA SCAN. IF IT SHOWS A DISEASED GALLBLADDER, I WILL SEND YOU TO THE SURGEON. STOP TAKING PRILOSEC FOR 2 WEEKS. THEN COMPLETE THE H PYLORI BREATH TEST. HYDROGEN BREATH TEST TO EVALUATE FOR SMALL BOWL BACTERIAL OVERGROWTH IN 7- 10 DAYS TO WORKUP YOUR BLOATING.  FOLLOW UP IN 3 MOS.

## 2015-06-18 ENCOUNTER — Encounter (HOSPITAL_COMMUNITY): Payer: Self-pay

## 2015-06-18 ENCOUNTER — Encounter (HOSPITAL_COMMUNITY)
Admission: RE | Admit: 2015-06-18 | Discharge: 2015-06-18 | Disposition: A | Discharge status: Home / Self Care | Payer: Medicare Other | Source: Ambulatory Visit | Attending: Gastroenterology | Admitting: Gastroenterology

## 2015-06-18 DIAGNOSIS — R109 Unspecified abdominal pain: Secondary | ICD-10-CM | POA: Diagnosis present

## 2015-06-18 DIAGNOSIS — R110 Nausea: Secondary | ICD-10-CM | POA: Diagnosis not present

## 2015-06-18 HISTORY — DX: Unspecified asthma, uncomplicated: J45.909

## 2015-06-18 MED ORDER — STERILE WATER FOR INJECTION IJ SOLN
INTRAMUSCULAR | Status: AC
  Administered 2015-06-18: 1.59 mL via INTRAVENOUS
  Filled 2015-06-18: qty 10

## 2015-06-18 MED ORDER — TECHNETIUM TC 99M MEBROFENIN IV KIT
5.0000 | PACK | Freq: Once | INTRAVENOUS | Status: DC | PRN
  Administered 2015-06-18: 5.2 via INTRAVENOUS
  Filled 2015-06-18: qty 6

## 2015-06-18 MED ORDER — SINCALIDE 5 MCG IJ SOLR
INTRAMUSCULAR | Status: AC
  Administered 2015-06-18: 1.59 ug via INTRAVENOUS
  Filled 2015-06-18: qty 5

## 2015-06-24 ENCOUNTER — Telehealth: Payer: Self-pay | Admitting: Gastroenterology

## 2015-06-24 NOTE — Telephone Encounter (Signed)
Pt called to see if her results from her nuclear testing from last week were back yet. Please call [phone]

## 2015-06-25 NOTE — Telephone Encounter (Signed)
PLEASE CALL PT. HER HIDA SCAN IS NORMAL.

## 2015-06-25 NOTE — Telephone Encounter (Signed)
Routing to Dr. Fields.  

## 2015-06-26 ENCOUNTER — Ambulatory Visit (HOSPITAL_COMMUNITY)
Admission: RE | Admit: 2015-06-26 | Discharge: 2015-06-26 | Disposition: A | Discharge status: Home / Self Care | Payer: Medicare Other | Source: Ambulatory Visit | Attending: Gastroenterology | Admitting: Gastroenterology

## 2015-06-26 ENCOUNTER — Encounter (HOSPITAL_COMMUNITY)
Admission: RE | Disposition: A | Discharge status: Home / Self Care | Payer: Self-pay | Source: Ambulatory Visit | Attending: Gastroenterology

## 2015-06-26 ENCOUNTER — Encounter (HOSPITAL_COMMUNITY): Payer: Self-pay | Admitting: *Deleted

## 2015-06-26 DIAGNOSIS — R140 Abdominal distension (gaseous): Secondary | ICD-10-CM | POA: Diagnosis not present

## 2015-06-26 DIAGNOSIS — R109 Unspecified abdominal pain: Secondary | ICD-10-CM | POA: Diagnosis not present

## 2015-06-26 DIAGNOSIS — R110 Nausea: Secondary | ICD-10-CM | POA: Insufficient documentation

## 2015-06-26 HISTORY — PX: BACTERIAL OVERGROWTH TEST: SHX5739

## 2015-06-26 SURGERY — BREATH TEST, FOR INTESTINAL BACTERIAL OVERGROWTH

## 2015-06-26 MED ORDER — LACTULOSE 10 GM/15ML PO SOLN
ORAL | Status: AC
  Administered 2015-06-26: 25 g via ORAL
  Filled 2015-06-26: qty 60

## 2015-06-26 MED ORDER — ONDANSETRON HCL 4 MG PO TABS: 4.0000 mg | ORAL_TABLET | Freq: Once | ORAL | Status: DC

## 2015-06-26 MED ORDER — ONDANSETRON 4 MG PO TBDP
4.0000 mg | ORAL_TABLET | Freq: Once | ORAL | Status: AC
  Administered 2015-06-26: 4 mg via ORAL

## 2015-06-26 MED ORDER — LACTULOSE 10 GM/15ML PO SOLN
25.0000 g | Freq: Once | ORAL | Status: AC
  Administered 2015-06-26: 25 g via ORAL

## 2015-06-26 MED ORDER — ONDANSETRON 4 MG PO TBDP
ORAL_TABLET | ORAL | Status: AC
  Administered 2015-06-26: 4 mg via ORAL
  Filled 2015-06-26: qty 1

## 2015-06-26 NOTE — Telephone Encounter (Signed)
Pt is aware of results. 

## 2015-06-26 NOTE — Progress Notes (Addendum)
No beans, bran or high fiber cereal the day before the procedure? yes NPO except for water 12 hours before procedure? yes No smoking, sleeping or vigorous exercising for at least 30 before procedure? yes Recent antibiotic use and/or diarrhea? no   If yes, physician notified.  Time Baseline 15 mins 30 mins 45 mins 60 mins 75 mins 90 mins 105 mins 120 mins 135 mins 150 mins 165 mins 180 mins  H2-ppm     3     3     12     23     40   45     33    32    35     34     38     41    39

## 2015-06-28 ENCOUNTER — Encounter (HOSPITAL_COMMUNITY): Payer: Self-pay | Admitting: Gastroenterology

## 2015-07-10 ENCOUNTER — Encounter (HOSPITAL_COMMUNITY): Payer: Medicare Other

## 2015-07-11 ENCOUNTER — Encounter (HOSPITAL_COMMUNITY): Payer: Medicare Other

## 2015-07-15 ENCOUNTER — Encounter (HOSPITAL_COMMUNITY): Admission: RE | Admit: 2015-07-15 | Payer: Medicare Other | Source: Ambulatory Visit

## 2015-08-07 ENCOUNTER — Encounter: Payer: Self-pay | Admitting: Gastroenterology

## 2015-08-29 ENCOUNTER — Telehealth: Payer: Self-pay | Admitting: Gastroenterology

## 2015-08-29 MED ORDER — METRONIDAZOLE 500 MG PO TABS
500.0000 mg | ORAL_TABLET | Freq: Two times a day (BID) | ORAL | Status: DC
Start: 2015-08-29 — End: 2020-04-11

## 2015-08-29 MED ORDER — CIPROFLOXACIN HCL 500 MG PO TABS: ORAL_TABLET | ORAL | Status: DC

## 2015-08-29 NOTE — Telephone Encounter (Signed)
Called patient TO DISCUSS RESULTS. APOLOGIZED FOR DELAY IN COMMUNICATING RESULTS. PT STILL HAS DISCOMFORT BUT SEVERITY AND DURATION OF EPISODES HAS IMPROVED.  UP TO PAMELOR 2 PO QHS. START CIP/FLAG for 5 days TO SEE IF WE CAN CONTINUE TO IMPROVE OF HER SYMPTOM CONTROL.  ALSO REVIEWED HIDA SCAN RESULTS AGAIN. FOLLOW UP IN FEB 2017 E30 NAUSEA/ABDOMINAL PAIN.

## 2015-08-29 NOTE — Telephone Encounter (Signed)
OV MADE AND LETTER MAILED °

## 2015-08-29 NOTE — Brief Op Note (Signed)
06/26/2015  12:00 PM  PATIENT:  Natasha Massey  50 y.o. female  PRE-OPERATIVE DIAGNOSIS:  bloating, abd pain, nausea  POST-OPERATIVE DIAGNOSIS:  POSSIBLE SMALL BOWEL INTESTINAL OVERGROWTH  PROCEDURE:  Procedure(s) with comments: BACTERIAL OVERGROWTH TEST (N/A) - 0700  SURGEON:  Surgeon(s) and Role:    * Danie Binder, MD - Primary  MEDICATIONS USED:  LACTULOSE  FINDINGS: BREATHALYZER- 0 MIN(3 PPM), FIRST PEAK (45 PPM, 75 MINS), TROUGH (32 PPM 105 MINS), SECOND PEAK (41 PPM, 165 MINS)  DIAGNOSIS: SMALL BOWEL BACTERIAL OVERGROWTH. Differential diagnosis includes colonic reading caused 2nd peak.  PLAN OF CARE:  1. CONSIDER CIP/FLAG for 7 days IF BLOATING NOT IMPROVED BY NEXT VISIT. 2. ADD PAMELOR 10 MG AT BEDTIME FOR 7 DAYS THEN 2 PO AT BEDTIME FOR 7 DAYS THEN 3 PO AT BEDTIME.  COMPLETE HIDA SCAN. IF IT SHOWS A DISEASED GALLBLADDER, I WILL SEND YOU TO THE SURGEON. 3. FOLLOW UP IN FEB 2017.

## 2015-09-05 ENCOUNTER — Other Ambulatory Visit: Payer: Self-pay | Admitting: Gastroenterology

## 2015-09-26 ENCOUNTER — Ambulatory Visit: Payer: Medicare Other | Admitting: Gastroenterology

## 2015-10-10 ENCOUNTER — Other Ambulatory Visit: Payer: Self-pay

## 2015-10-10 MED ORDER — NORTRIPTYLINE HCL 10 MG PO CAPS: 30.0000 mg | ORAL_CAPSULE | Freq: Every day | ORAL | Status: DC

## 2015-10-14 ENCOUNTER — Other Ambulatory Visit: Payer: Self-pay

## 2015-10-16 ENCOUNTER — Telehealth: Payer: Self-pay

## 2015-10-16 NOTE — Telephone Encounter (Signed)
Noted and agree. 

## 2015-10-16 NOTE — Telephone Encounter (Signed)
Received a fax from [facility] in [place]. Pt was requesting a 90 supply of the nortriptyline. Neil Crouch, PA had refilled for 30 days with 5 refills on 10/10/2015. I called pharmacy and spoke to [facility] and told her it is OK to give pt 90 day supply ( 3 tablets at bedtime) with one refill.

## 2015-11-05 IMAGING — CT CT ABD-PEL WO/W CM
1 of 4 series · 11 of 32 positions shown, 17 images · IV contrast (OMNIPAQUE)
Comparison: 08/24/2011.

CLINICAL DATA: Initial encounter for Mid abdominal pain radiating
down to left lower quadrant for 3 days. Nausea with low-grade fever
and diarrhea.

EXAM:
CT ABDOMEN AND PELVIS WITHOUT AND WITH CONTRAST
TECHNIQUE: Multidetector CT imaging of the abdomen and pelvis was performed
following the standard protocol before and following the bolus
administration of intravenous contrast.
CONTRAST:  100mL OMNIPAQUE IOHEXOL 300 MG/ML  SOLN

[Series 4: routine abdomen/pelvis with · axial · 0.77mm/px · z∈[-420,-70]mm · 11 of 84 slices shown, 17 images]
[im 7/84  soft-tissue]
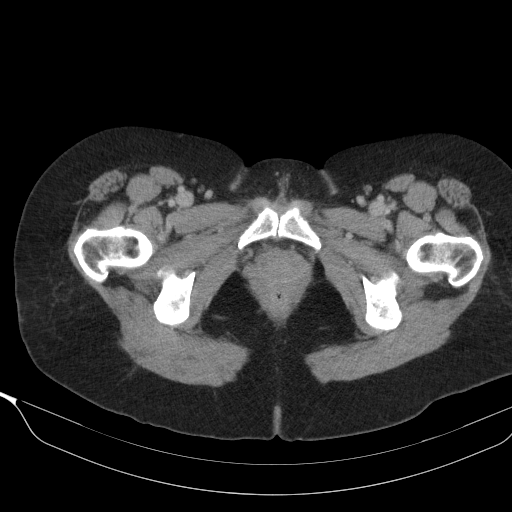
[im 7/84  bone]
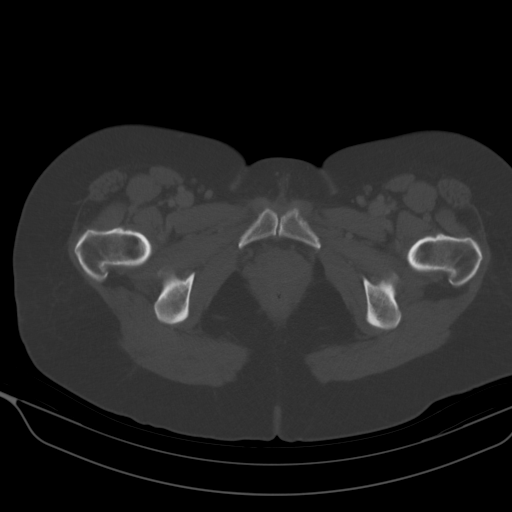
[im 14/84  soft-tissue]
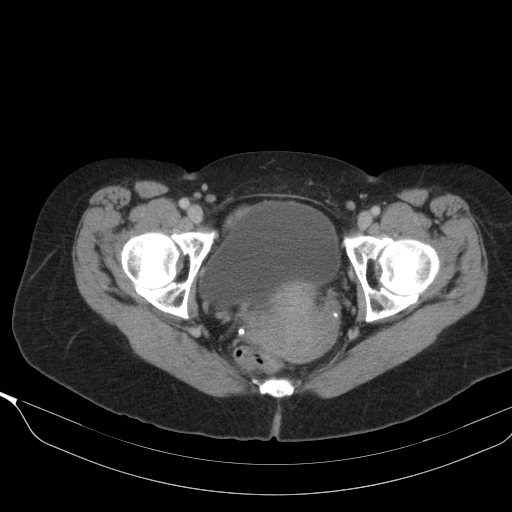
[im 21/84  soft-tissue]
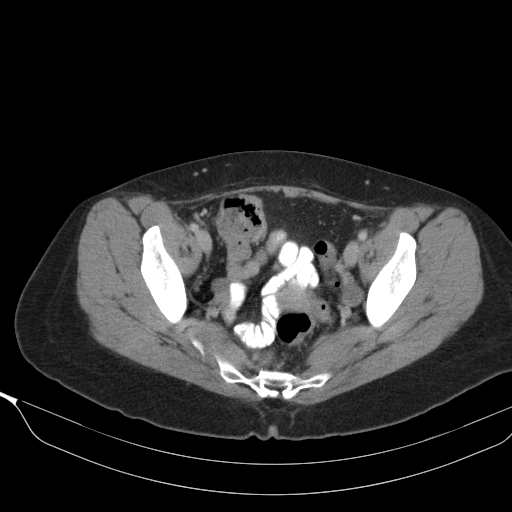
[im 28/84  soft-tissue]
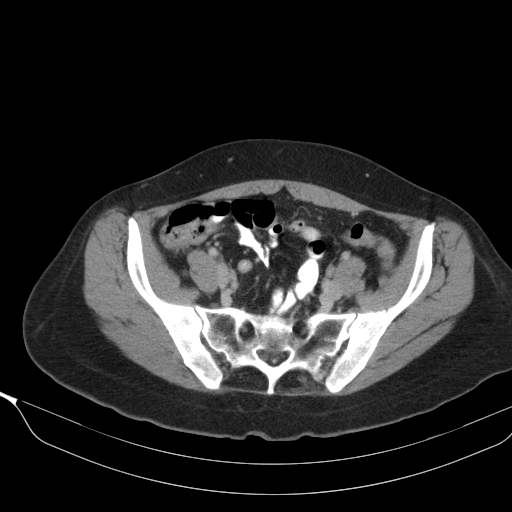
[im 35/84  soft-tissue]
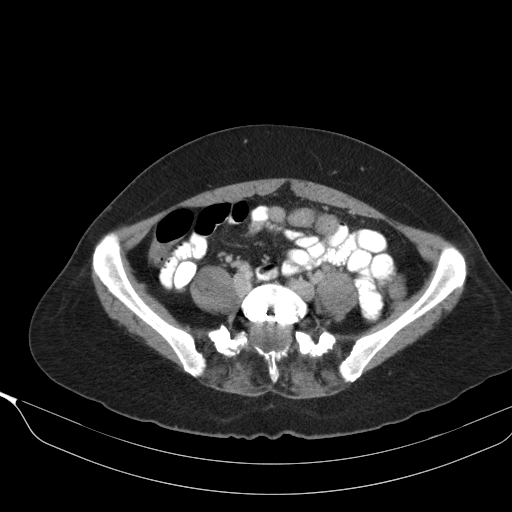
[im 42/84  soft-tissue]
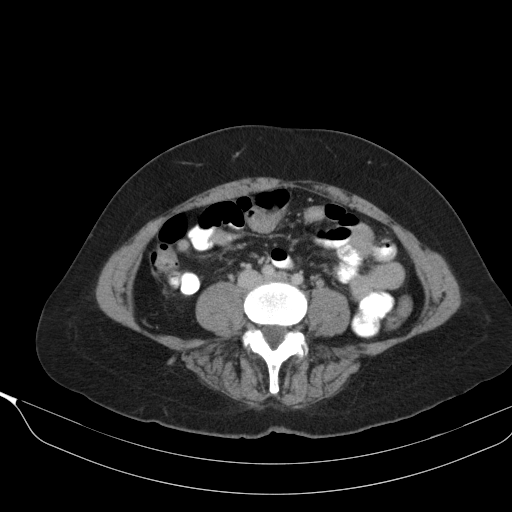
[im 49/84  soft-tissue]
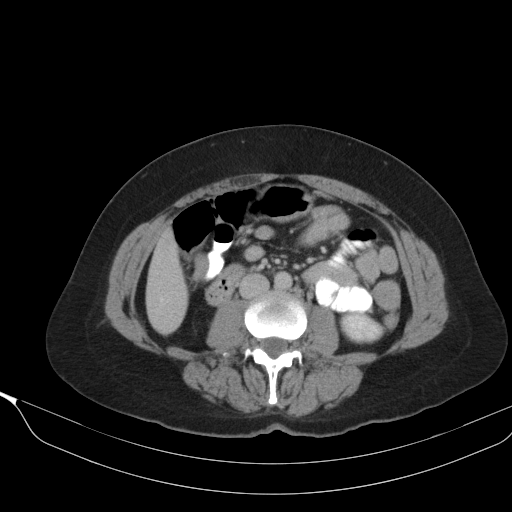
[im 56/84  soft-tissue]
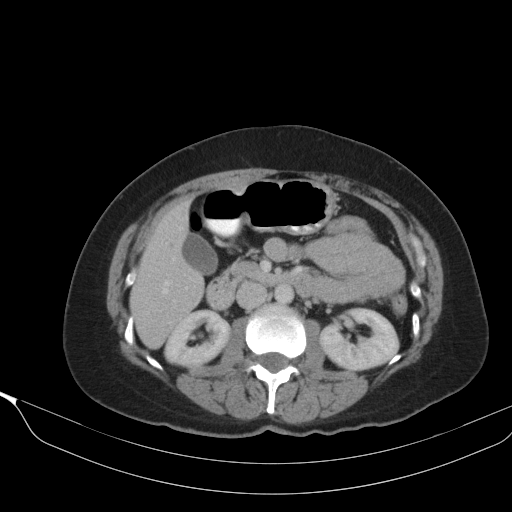
[im 56/84  lung]
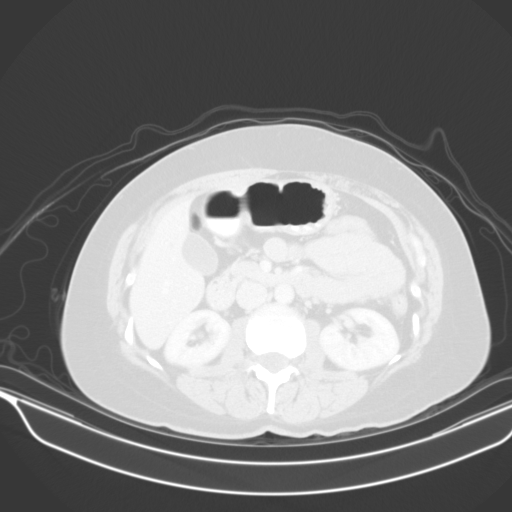
[im 63/84  soft-tissue]
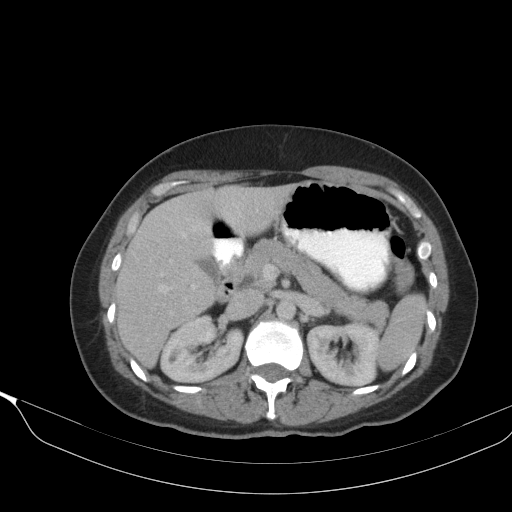
[im 63/84  lung]
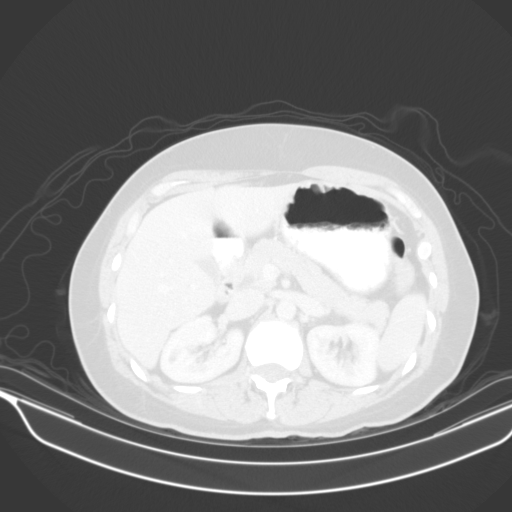
[im 63/84  bone]
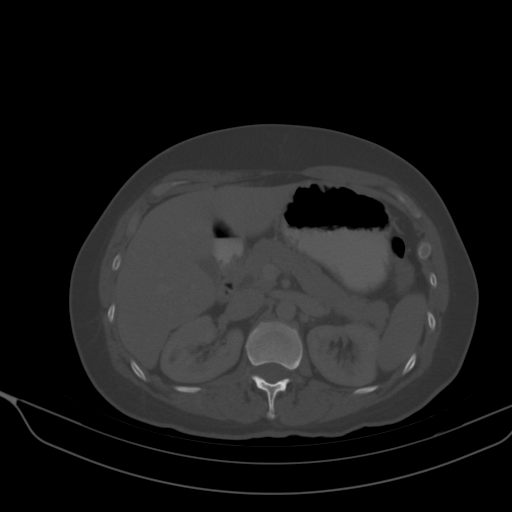
[im 70/84  soft-tissue]
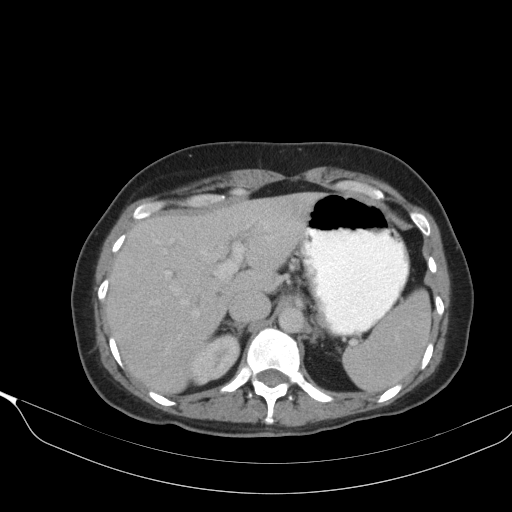
[im 70/84  lung]
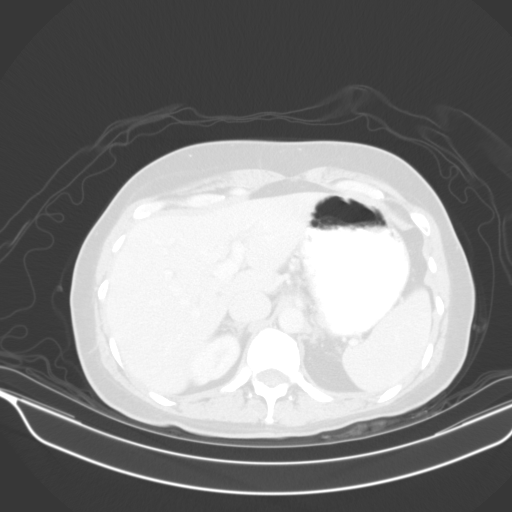
[im 77/84  soft-tissue]
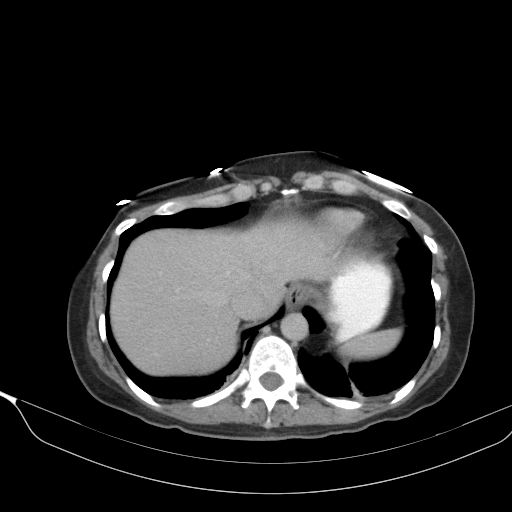
[im 77/84  lung]
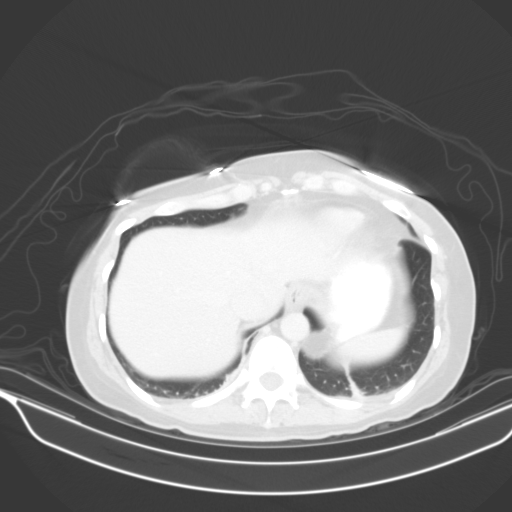

[11 of 32 positions shown; findings below may reference images not displayed]

FINDINGS: Lower chest: Incompletely visualized atelectasis or pneumonia seen
at the base of the lingula. There is some subsegmental atelectasis
in the posterior left lower lobe.

Hepatobiliary: Stable 14 mm bilobed left hepatic cyst. Other
scattered tiny hepatic cysts are stable. There is no evidence for
gallstones, gallbladder wall thickening, or pericholecystic fluid.
No intrahepatic or extrahepatic biliary dilation. Slight
intrahepatic biliary prominence is unchanged.

Pancreas: No focal mass lesion. No dilatation of the main duct. No
intraparenchymal cyst. No peripancreatic edema.

Spleen: No splenomegaly. No focal mass lesion.

Adrenals/Urinary Tract: No adrenal nodule or mass. Stable tiny
bilateral low-density renal lesions, likely reflecting cysts. No
ureteral dilatation. Bladder is unremarkable.

Stomach/Bowel: Stomach is nondistended. No gastric wall thickening.
No evidence of outlet obstruction. Duodenum is normally positioned
as is the ligament of Treitz. No small bowel wall thickening. No
small bowel dilatation. Terminal ileum is normal. The appendix is
located along the right pelvic sidewall and is normal. No gross
colonic mass. No colonic wall thickening. No substantial
diverticular change.

Vascular/Lymphatic: No abdominal aortic aneurysm. There is no
lymphadenopathy in the abdomen or pelvis.

Reproductive: Fibroid change noted in the uterus.  No adnexal mass.

Other: No intraperitoneal free fluid.

Musculoskeletal: Bone windows reveal no worrisome lytic or sclerotic
osseous lesions. Status post L5-S1 fusion.
IMPRESSION: No acute findings in the abdomen or pelvis. Specifically, no
findings to explain the patient's history of abdominal pain
radiating to the left lower quadrant. No colonic diverticulitis or
adnexal mass.

Focal area of airspace opacity in the lingula has been incompletely
visualized. This could reflect atelectasis or pneumonia.

## 2016-08-06 ENCOUNTER — Other Ambulatory Visit: Payer: Self-pay | Admitting: Nurse Practitioner

## 2016-12-23 ENCOUNTER — Other Ambulatory Visit: Payer: Self-pay | Admitting: Gastroenterology

## 2016-12-24 MED ORDER — NORTRIPTYLINE HCL 10 MG PO CAPS: ORAL_CAPSULE | ORAL | 0 refills | Status: DC

## 2016-12-24 NOTE — Telephone Encounter (Signed)
RX done but would not go electronically so I printed it. Patient due for ov with [facility] only.

## 2016-12-25 ENCOUNTER — Encounter: Payer: Self-pay | Admitting: Gastroenterology

## 2016-12-25 NOTE — Telephone Encounter (Signed)
Rx faxed to the pharmacy and forwarding to [facility] to schedule the OV appt.

## 2017-01-27 ENCOUNTER — Ambulatory Visit: Payer: Medicare Other | Admitting: Gastroenterology

## 2017-03-25 NOTE — Progress Notes (Signed)
Cardiology Office Note   Date:  03/26/2017   ID:  Natasha Massey, DOB 09/24/1965, MRN [id number]  PCP:  Dione Housekeeper, MD  Cardiologist:  [facility]  Chief Complaint  Patient presents with  . Chest Pain  . Palpitations      History of Present Illness: Natasha Massey is a 51 y.o. female who presents for ongoing assessment and management of chest discomfort, has not been seen by cardiology since July 2016. She was seen in the past by Dr. Percival Spanish. Other history includes COPD, anxiety, GERD followed by GI.  She had a low risk nuclear medicine stress test dated 04/02/2015, revealing no reversible ischemia. EF of 54%. The patient requests appointment due to elevated heart rates and to be reestablished with cardiology.  She comes today with multiple complaints. She states she's been feeling bandlike chest tightness with and without exertion. She also states that she's felt her heart racing at different times throughout the day sometimes awakening her at night. Patient also states that she has pain in her arms bilaterally. Sometimes pain is so bad that she cannot hold them up, or be touched due to severe sensitivity. She states when her heart rate increases she feels that in her jaw and her chest feels tight. Nothing makes it better, nothing makes it worse. She states that she's becoming more concerned about the recurrence of chest tightness and racing heart rate.  Past Medical History:  Diagnosis Date  . Anxiety   . Asthma   . COPD (chronic obstructive pulmonary disease) ([place])   . GERD (gastroesophageal reflux disease)   . Neuropathy   . Pneumonia     Past Surgical History:  Procedure Laterality Date  . BACK SURGERY  2001   lumbar disc and cage  . BACTERIAL OVERGROWTH TEST N/A 06/26/2015   Procedure: BACTERIAL OVERGROWTH TEST;  Surgeon: Danie Binder, MD;  [facility];  Service: Endoscopy;  Laterality: N/A;  0700  . BIOPSY N/A 09/11/2014   Procedure: DUODENAL AND GASTRIC  BIOPSIES;  Surgeon: Danie Binder, MD;  Location: [facility];  Service: Endoscopy;  Laterality: N/A;  . ENDOMETRIAL ABLATION    . ESOPHAGOGASTRODUODENOSCOPY (EGD) WITH PROPOFOL N/A 09/11/2014   SLF: Normal esophageal mucosa. 2. Small hiatal hernia. 3. Moderate nonerosive erthyema and edema in the antraum. col forceps biopsies taken. 4. Mild erthyema and edema in teh duodenal bulb. Normal second portion of the duodenum. cold fordceps biopsies obtained to evaluate for eosinophilic gastritis. 5. Nausea and vomiting most likely due to reflux, gastrittis, and duodenitis.   . TUBAL LIGATION       Current Outpatient Prescriptions  Medication Sig Dispense Refill  . ciprofloxacin (CIPRO) 500 MG tablet 1 PO BID FOR 5 DAYS 10 tablet 0  . diazepam (VALIUM) 5 MG tablet Take 5 mg by mouth 4 (four) times daily.    Marland Kitchen gabapentin (NEURONTIN) 100 MG capsule Take 200 mg by mouth 2 (two) times daily.    . metroNIDAZOLE (FLAGYL) 500 MG tablet Take 1 tablet (500 mg total) by mouth 2 (two) times daily. FOR 5 DAYS 10 tablet 0  . nortriptyline (PAMELOR) 10 MG capsule TAKE 3 CAPSULES AT BEDTIME 270 capsule 0  . omeprazole (PRILOSEC) 20 MG capsule 1 po 30 mins prior to breakfast and supper 60 capsule 11  . ondansetron (ZOFRAN) 4 MG tablet TAKE 1 TABLET (4 MG TOTAL) BY MOUTH EVERY 8 (EIGHT) HOURS AS NEEDED FOR NAUSEA OR VOMITING. 20 tablet 0  . PROAIR HFA 108 (  90 BASE) MCG/ACT inhaler Inhale 2 puffs into the lungs every 4 (four) hours as needed.  5  . zolpidem (AMBIEN) 5 MG tablet Take 5 mg by mouth at bedtime as needed for sleep.     No current facility-administered medications for this visit.     Allergies:   Amoxicillin; Aspirin; Betadine [povidone iodine]; and Penicillins    Social History:  The patient  reports that she has been smoking Cigarettes.  She has a 17.50 pack-year smoking history. She has never used smokeless tobacco. She reports that she does not drink alcohol or use drugs.   Family History:  The  patient's family history includes Breast cancer in her other; Heart attack (age of onset: 35) in her maternal grandmother; Inflammatory bowel disease in her unknown relative; Lung cancer in her sister; Lung cancer (age of onset: 45) in her brother; Renal cancer in her father.    ROS: All other systems are reviewed and negative. Unless otherwise mentioned in H&P    PHYSICAL EXAM: VS:  BP 118/76 (BP Location: Left Arm)   Pulse 70   Ht 5\' 6"  (1.676 m)   Wt 178 lb (80.7 kg)   SpO2 96%   BMI 28.73 kg/m  , BMI Body mass index is 28.73 kg/m. GEN: Well nourished, well developed, in no acute distress  HEENT: normal  Neck: no JVD, carotid bruits, or masses Cardiac: RRR; no murmurs, rubs, or gallops,no edema  Respiratory:  Clear to auscultation bilaterally, normal work of breathing GI: soft, nontender, nondistended, + BS MS: no deformity or atrophy  Skin: warm and dry, no rash Neuro:  Strength and sensation are intact Psych: euthymic mood, full affect   EKG:  The ekg ordered today demonstrates  normal sinus rhythm rate of 72 bpm. No evidence of WPW or Brugada.  Recent Labs: No results found for requested labs within last 8760 hours.    Lipid Panel No results found for: CHOL, TRIG, HDL, CHOLHDL, VLDL, LDLCALC, LDLDIRECT    Wt Readings from Last 3 Encounters:  03/26/17 178 lb (80.7 kg)  06/13/15 178 lb 9.6 oz (81 kg)  04/02/15 176 lb (79.8 kg)      Other studies Reviewed: Echocardiogram Dec 17, 2007 SUMMARY - Overall left ventricular systolic function was normal. There were    no left ventricular regional wall motion abnormalities.  ASSESSMENT AND PLAN:  1. Chest pain: Uncertain if this is cardiac in etiology as she has multiple somatic symptoms of pain in her back arms and neck throughout the day with and without exertion. She is on Neurontin by her primary care physician which she states is not helpful. I'm going to check some labs a BMET, magnesium as she is on a PPI, CBC to  rule out anemia. The patient will have a stress test to have a second evaluation for ischemia.Marland Kitchenwill also check fasting lipid studies and Hgb A1C as she has family history of diabetes and hypercholesterolemia.   2. Frequent palpitations: Echocardiogram will be ordered to evaluate for structural heart disease. A 48-hour Holter will be placed if she states this happens every day several times a day. Going to check TSH, T4 and T3.  3. Ongoing tobacco abuse: The patient has been smoking a pack a day but has recently decreased to half a pack a day this week. I've encouraged her to stop altogether.  4. Chronic lung disease: She does use inhalers when necessary. She has not had formal PFTs. This can be considered after cardiac testing is complete  and can be followed by primary care physician Dr. Edrick Oh.  5. History of hypothyroidism: Medications are being adjusted by primary care but she has not seen them since June. Adding thyroid studies to follow-up labs so that they can be reviewed by primary care.   Current medicines are reviewed at length with the patient today.    Labs/ tests ordered today include: as above  Phill Myron. West Pugh, ANP, AACC   03/26/2017 1:58 PM    [facility] [address], [place], [place] [address] Phone: [phone]; Fax: [phone]

## 2017-03-26 ENCOUNTER — Encounter: Payer: Self-pay | Admitting: Adult Health

## 2017-03-26 ENCOUNTER — Other Ambulatory Visit (HOSPITAL_COMMUNITY)
Admission: RE | Admit: 2017-03-26 | Discharge: 2017-03-26 | Disposition: A | Discharge status: Home / Self Care | Payer: Medicare Other | Source: Ambulatory Visit | Attending: Adult Health | Admitting: Adult Health

## 2017-03-26 ENCOUNTER — Ambulatory Visit (INDEPENDENT_AMBULATORY_CARE_PROVIDER_SITE_OTHER): Payer: Medicare Other | Admitting: Adult Health

## 2017-03-26 ENCOUNTER — Encounter: Payer: Self-pay | Admitting: *Deleted

## 2017-03-26 VITALS — BP 118/76 | HR 70 | Ht 66.0 in | Wt 178.0 lb

## 2017-03-26 DIAGNOSIS — E038 Other specified hypothyroidism: Secondary | ICD-10-CM

## 2017-03-26 DIAGNOSIS — R079 Chest pain, unspecified: Secondary | ICD-10-CM | POA: Diagnosis not present

## 2017-03-26 DIAGNOSIS — Z720 Tobacco use: Secondary | ICD-10-CM | POA: Diagnosis not present

## 2017-03-26 DIAGNOSIS — R0602 Shortness of breath: Secondary | ICD-10-CM

## 2017-03-26 DIAGNOSIS — R002 Palpitations: Secondary | ICD-10-CM | POA: Diagnosis not present

## 2017-03-26 LAB — CBC WITH DIFFERENTIAL/PLATELET
BASOS PCT: 1 %
Basophils Absolute: 0.1 10*3/uL (ref 0.0–0.1)
EOS ABS: 0.1 10*3/uL (ref 0.0–0.7)
Eosinophils Relative: 1 %
HCT: 43.7 % (ref 36.0–46.0)
HEMOGLOBIN: 14.6 g/dL (ref 12.0–15.0)
Lymphocytes Relative: 33 %
Lymphs Abs: 3.1 10*3/uL (ref 0.7–4.0)
MCH: 30.3 pg (ref 26.0–34.0)
MCHC: 33.4 g/dL (ref 30.0–36.0)
MCV: 90.7 fL (ref 78.0–100.0)
Monocytes Absolute: 0.6 10*3/uL (ref 0.1–1.0)
Monocytes Relative: 6 %
NEUTROS ABS: 5.5 10*3/uL (ref 1.7–7.7)
NEUTROS PCT: 59 %
Platelets: 329 10*3/uL (ref 150–400)
RBC: 4.82 MIL/uL (ref 3.87–5.11)
RDW: 14 % (ref 11.5–15.5)
WBC: 9.2 10*3/uL (ref 4.0–10.5)

## 2017-03-26 LAB — COMPREHENSIVE METABOLIC PANEL
ALBUMIN: 4.2 g/dL (ref 3.5–5.0)
ALK PHOS: 71 U/L (ref 38–126)
ALT: 14 U/L (ref 14–54)
ANION GAP: 9 (ref 5–15)
AST: 17 U/L (ref 15–41)
BUN: 12 mg/dL (ref 6–20)
CALCIUM: 9.5 mg/dL (ref 8.9–10.3)
CO2: 24 mmol/L (ref 22–32)
Chloride: 104 mmol/L (ref 101–111)
Creatinine, Ser: 0.79 mg/dL (ref 0.44–1.00)
GFR calc Af Amer: >60 mL/min (ref >60)
GFR calc non Af Amer: >60 mL/min (ref >60)
GLUCOSE: 97 mg/dL (ref 65–99)
Potassium: 4.1 mmol/L (ref 3.5–5.1)
SODIUM: 137 mmol/L (ref 135–145)
Total Bilirubin: 0.5 mg/dL (ref 0.3–1.2)
Total Protein: 7.4 g/dL (ref 6.5–8.1)

## 2017-03-26 LAB — LIPID PANEL
Cholesterol: 223 mg/dL — ABNORMAL HIGH (ref 0–200)
HDL: 89 mg/dL (ref >40)
LDL Cholesterol: 123 mg/dL — ABNORMAL HIGH (ref 0–99)
Total CHOL/HDL Ratio: 2.5 RATIO
Triglycerides: 55 mg/dL (ref <150)
VLDL: 11 mg/dL (ref 0–40)

## 2017-03-26 LAB — MAGNESIUM: Magnesium: 2.3 mg/dL (ref 1.7–2.4)

## 2017-03-26 LAB — TSH: TSH: 3.026 u[IU]/mL (ref 0.350–4.500)

## 2017-03-26 NOTE — Patient Instructions (Signed)
Medication Instructions:  Your physician recommends that you continue on your current medications as directed. Please refer to the Current Medication list given to you today.   Labwork: Your physician recommends that you return for lab work in: Today   Testing/Procedures: Your physician has requested that you have an echocardiogram. Echocardiography is a painless test that uses sound waves to create images of your heart. It provides your doctor with information about the size and shape of your heart and how well your heart's chambers and valves are working. This procedure takes approximately one hour. There are no restrictions for this procedure.  Your physician has recommended that you wear a holter monitor. Holter monitors are medical devices that record the heart's electrical activity. Doctors most often use these monitors to diagnose arrhythmias. Arrhythmias are problems with the speed or rhythm of the heartbeat. The monitor is a small, portable device. You can wear one while you do your normal daily activities. This is usually used to diagnose what is causing palpitations/syncope (passing out).  Your physician has requested that you have en exercise stress myoview. For further information please visit [email or web address]. Please follow instruction sheet, as given.    Follow-Up: Your physician recommends that you schedule a follow-up appointment in: 1 Month     Any Other Special Instructions Will Be Listed Below (If Applicable).     If you need a refill on your cardiac medications before your next appointment, please call your pharmacy.  Thank you for choosing [facility]!

## 2017-03-27 LAB — T3 UPTAKE: T3 UPTAKE RATIO: 26 % (ref 24–39)

## 2017-03-27 LAB — HEMOGLOBIN A1C
Hgb A1c MFr Bld: 5.6 % (ref 4.8–5.6)
Mean Plasma Glucose: 114 mg/dL

## 2017-03-27 LAB — T3, FREE: T3 FREE: 2.2 pg/mL (ref 2.0–4.4)

## 2017-04-01 ENCOUNTER — Encounter (HOSPITAL_COMMUNITY)
Admission: RE | Admit: 2017-04-01 | Discharge: 2017-04-01 | Disposition: A | Discharge status: Home / Self Care | Payer: Medicare Other | Source: Ambulatory Visit | Attending: Adult Health | Admitting: Adult Health

## 2017-04-01 ENCOUNTER — Ambulatory Visit (HOSPITAL_COMMUNITY)
Admission: RE | Admit: 2017-04-01 | Discharge: 2017-04-01 | Disposition: A | Discharge status: Home / Self Care | Payer: Medicare Other | Source: Ambulatory Visit | Attending: Adult Health | Admitting: Adult Health

## 2017-04-01 ENCOUNTER — Encounter (HOSPITAL_COMMUNITY): Payer: Self-pay

## 2017-04-01 DIAGNOSIS — J449 Chronic obstructive pulmonary disease, unspecified: Secondary | ICD-10-CM | POA: Diagnosis not present

## 2017-04-01 DIAGNOSIS — R0602 Shortness of breath: Secondary | ICD-10-CM | POA: Diagnosis not present

## 2017-04-01 DIAGNOSIS — R079 Chest pain, unspecified: Secondary | ICD-10-CM | POA: Insufficient documentation

## 2017-04-01 DIAGNOSIS — K219 Gastro-esophageal reflux disease without esophagitis: Secondary | ICD-10-CM | POA: Insufficient documentation

## 2017-04-01 LAB — ECHOCARDIOGRAM COMPLETE
AVLVOTPG: 3 mmHg
CHL CUP DOP CALC LVOT VTI: 23.1 cm
CHL CUP MV DEC (S): 275
CHL CUP STROKE VOLUME: 44 mL
E decel time: 275 msec
E/e' ratio: 3.9
FS: 29 % (ref 28–44)
IVS/LV PW RATIO, ED: 0.83
LA ID, A-P, ES: 36 mm
LA diam end sys: 36 mm
LA diam index: 1.84 cm/m2
LA vol: 40.7 mL
LAVOLA4C: 32.7 mL
LAVOLIN: 20.8 mL/m2
LDCA: 2.84 cm2
LV E/e' medial: 3.9
LV E/e'average: 3.9
LV dias vol: 78 mL (ref 46–106)
LV e' LATERAL: 12.7 cm/s
LVDIAVOLIN: 40 mL/m2
LVOT SV: 66 mL
LVOTD: 19 mm
LVOTPV: 90.6 cm/s
LVSYSVOL: 34 mL (ref 14–42)
LVSYSVOLIN: 17 mL/m2
MV pk A vel: 57.7 m/s
MV pk E vel: 49.5 m/s
PW: 9.6 mm — AB (ref 0.6–1.1)
RV LATERAL S' VELOCITY: 10.7 cm/s
RV TAPSE: 16 mm
Simpson's disk: 57
TDI e' lateral: 12.7
TDI e' medial: 9.25

## 2017-04-01 LAB — NM MYOCAR MULTI W/SPECT W/WALL MOTION / EF
CHL CUP NUCLEAR SDS: 0
CHL CUP NUCLEAR SRS: 0
CHL CUP NUCLEAR SSS: 0
CHL CUP RESTING HR STRESS: 55 {beats}/min
CSEPPHR: 110 {beats}/min
LV dias vol: 88 mL (ref 46–106)
LV sys vol: 35 mL
NUC STRESS TID: 1.05
RATE: 0.36

## 2017-04-01 MED ORDER — TECHNETIUM TC 99M TETROFOSMIN IV KIT
10.0000 | PACK | Freq: Once | INTRAVENOUS | Status: AC | PRN
  Administered 2017-04-01: 11 via INTRAVENOUS

## 2017-04-01 MED ORDER — REGADENOSON 0.4 MG/5ML IV SOLN
INTRAVENOUS | Status: AC
  Administered 2017-04-01: 0.4 mg via INTRAVENOUS
  Filled 2017-04-01: qty 5

## 2017-04-01 MED ORDER — TECHNETIUM TC 99M TETROFOSMIN IV KIT
30.0000 | PACK | Freq: Once | INTRAVENOUS | Status: AC | PRN
  Administered 2017-04-01: 31 via INTRAVENOUS

## 2017-04-01 MED ORDER — SODIUM CHLORIDE 0.9% FLUSH
INTRAVENOUS | Status: AC
  Administered 2017-04-01: 10 mL via INTRAVENOUS
  Filled 2017-04-01: qty 10

## 2017-04-01 NOTE — Progress Notes (Signed)
*  PRELIMINARY RESULTS* Echocardiogram 2D Echocardiogram has been performed.  Natasha Massey 04/01/2017, 11:55 AM

## 2017-04-27 ENCOUNTER — Ambulatory Visit: Payer: Medicare Other | Admitting: Adult Health

## 2017-04-27 ENCOUNTER — Encounter: Payer: Self-pay | Admitting: Adult Health

## 2017-04-27 NOTE — Progress Notes (Deleted)
Cardiology Office Note   Date:  04/27/2017   ID:  Natasha Massey, DOB 1965/08/25, MRN [id number]  PCP:  Dione Housekeeper, MD  Cardiologist:  [facility]  No chief complaint on file.     History of Present Illness: Natasha Massey is a 51 y.o. female who presents for ongoing assessment and management of chest discomfort, with other history to includes COPD, anxiety, and GERD. She was last seen in the office on 03/26/2017 with multiple complaints. One of which was feeling her heart was racing, sometimes awakening her at night. She also complaints of a lateral arm pain,  chest tightness.   Echocardiogram was ordered to evaluate for structural heart disease, 48 hour Holter monitor was placed to evaluate for arrhythmias, she was counseled on tobacco cessation, follow-up labs were ordered to include a BMET, magnesium, CBC, fasting lipids LFTs, and hemoglobin A1c along with thyroid studies. and a stress test was considered for evaluation for ischemia,  Labs: Sodium 137 potassium 4.1, chloride 104, CO2 24, glucose 97, BUN 12, creatinine 0.79. Total cholesterol 233, HDL 89, LDL 123. Hemoglobin A1c 5.6, TSH 3.026. Magnesium 2.3.  NM Stress Test Study Result    There was no ST segment deviation noted during stress.  The study is normal. There are no perfusion defects consistent with prior infarct or current ishcemia  This is a low risk study.  The left ventricular ejection fraction is normal (55-65%).   Echocardiogram 04/01/2017 Study Conclusions  - Left ventricle: The cavity size was normal. Wall thickness was   normal. Systolic function was normal. The estimated ejection   fraction was in the range of 50% to 55%. Wall motion was normal;   there were no regional wall motion abnormalities. Left   ventricular diastolic function parameters were normal. - Aortic valve: Valve area (VTI): 2.89 cm^2. Valve area (Vmax):   2.32 cm^2. Valve area (Vmean): 1.97 cm^2. - Technically adequate study.  Past  Medical History:  Diagnosis Date  . Anxiety   . Asthma   . COPD (chronic obstructive pulmonary disease) ([place])   . GERD (gastroesophageal reflux disease)   . Neuropathy   . Pneumonia     Past Surgical History:  Procedure Laterality Date  . BACK SURGERY  2001   lumbar disc and cage  . BACTERIAL OVERGROWTH TEST N/A 06/26/2015   Procedure: BACTERIAL OVERGROWTH TEST;  Surgeon: Danie Binder, MD;  [facility];  Service: Endoscopy;  Laterality: N/A;  0700  . BIOPSY N/A 09/11/2014   Procedure: DUODENAL AND GASTRIC BIOPSIES;  Surgeon: Danie Binder, MD;  Location: [facility];  Service: Endoscopy;  Laterality: N/A;  . ENDOMETRIAL ABLATION    . ESOPHAGOGASTRODUODENOSCOPY (EGD) WITH PROPOFOL N/A 09/11/2014   SLF: Normal esophageal mucosa. 2. Small hiatal hernia. 3. Moderate nonerosive erthyema and edema in the antraum. col forceps biopsies taken. 4. Mild erthyema and edema in teh duodenal bulb. Normal second portion of the duodenum. cold fordceps biopsies obtained to evaluate for eosinophilic gastritis. 5. Nausea and vomiting most likely due to reflux, gastrittis, and duodenitis.   . TUBAL LIGATION       Current Outpatient Prescriptions  Medication Sig Dispense Refill  . ciprofloxacin (CIPRO) 500 MG tablet 1 PO BID FOR 5 DAYS 10 tablet 0  . diazepam (VALIUM) 5 MG tablet Take 5 mg by mouth 4 (four) times daily.    Marland Kitchen gabapentin (NEURONTIN) 100 MG capsule Take 200 mg by mouth 2 (two) times daily.    . metroNIDAZOLE (FLAGYL)  500 MG tablet Take 1 tablet (500 mg total) by mouth 2 (two) times daily. FOR 5 DAYS 10 tablet 0  . nortriptyline (PAMELOR) 10 MG capsule TAKE 3 CAPSULES AT BEDTIME 270 capsule 0  . omeprazole (PRILOSEC) 20 MG capsule 1 po 30 mins prior to breakfast and supper 60 capsule 11  . ondansetron (ZOFRAN) 4 MG tablet TAKE 1 TABLET (4 MG TOTAL) BY MOUTH EVERY 8 (EIGHT) HOURS AS NEEDED FOR NAUSEA OR VOMITING. 20 tablet 0  . PROAIR HFA 108 (90 BASE) MCG/ACT inhaler Inhale 2 puffs  into the lungs every 4 (four) hours as needed.  5  . zolpidem (AMBIEN) 5 MG tablet Take 5 mg by mouth at bedtime as needed for sleep.     No current facility-administered medications for this visit.     Allergies:   Amoxicillin; Aspirin; Bee venom; Betadine [povidone iodine]; and Penicillins    Social History:  The patient  reports that she has been smoking Cigarettes.  She has a 17.50 pack-year smoking history. She has never used smokeless tobacco. She reports that she does not drink alcohol or use drugs.   Family History:  The patient's family history includes Breast cancer in her other; Heart attack (age of onset: 17) in her maternal grandmother; Inflammatory bowel disease in her unknown relative; Lung cancer in her sister; Lung cancer (age of onset: 92) in her brother; Renal cancer in her father.    ROS: All other systems are reviewed and negative. Unless otherwise mentioned in H&P    PHYSICAL EXAM: VS:  There were no vitals taken for this visit. , BMI There is no height or weight on file to calculate BMI. GEN: Well nourished, well developed, in no acute distress  HEENT: normal  Neck: no JVD, carotid bruits, or masses Cardiac: ***RRR; no murmurs, rubs, or gallops,no edema  Respiratory:  clear to auscultation bilaterally, normal work of breathing GI: soft, nontender, nondistended, + BS MS: no deformity or atrophy  Skin: warm and dry, no rash Neuro:  Strength and sensation are intact Psych: euthymic mood, full affect   EKG:  EKG {ACTION; IS/IS [id number]} ordered today. The ekg ordered today demonstrates ***   Recent Labs: 03/26/2017: ALT 14; BUN 12; Creatinine, Ser 0.79; Hemoglobin 14.6; Magnesium 2.3; Platelets 329; Potassium 4.1; Sodium 137; TSH 3.026    Lipid Panel    Component Value Date/Time   CHOL 223 (H) 03/26/2017 1433   TRIG 55 03/26/2017 1433   HDL 89 03/26/2017 1433   CHOLHDL 2.5 03/26/2017 1433   VLDL 11 03/26/2017 1433   LDLCALC 123 (H) 03/26/2017 1433        Wt Readings from Last 3 Encounters:  03/26/17 178 lb (80.7 kg)  06/13/15 178 lb 9.6 oz (81 kg)  04/02/15 176 lb (79.8 kg)      Other studies Reviewed: Additional studies/ records that were reviewed today include: ***. Review of the above records demonstrates: ***   ASSESSMENT AND PLAN:  1.  ***   Current medicines are reviewed at length with the patient today.    Labs/ tests ordered today include: *** Phill Myron. West Pugh, ANP, AACC   04/27/2017 7:19 AM    [facility] [address], [place], [place] [address] Phone: [phone]; Fax: [phone]

## 2018-12-19 NOTE — Progress Notes (Signed)
REVIEWED-NO ADDITIONAL RECOMMENDATIONS. 

## 2019-11-13 ENCOUNTER — Encounter: Payer: Self-pay | Admitting: Gastroenterology

## 2019-11-22 ENCOUNTER — Ambulatory Visit: Payer: Medicare Other | Admitting: Gastroenterology

## 2020-02-19 ENCOUNTER — Encounter: Payer: Self-pay | Admitting: Internal Medicine

## 2020-04-11 ENCOUNTER — Ambulatory Visit (INDEPENDENT_AMBULATORY_CARE_PROVIDER_SITE_OTHER): Payer: Medicare Other | Admitting: Nurse Practitioner

## 2020-04-11 ENCOUNTER — Encounter: Payer: Self-pay | Admitting: Nurse Practitioner

## 2020-04-11 ENCOUNTER — Other Ambulatory Visit: Payer: Self-pay

## 2020-04-11 DIAGNOSIS — R109 Unspecified abdominal pain: Secondary | ICD-10-CM | POA: Insufficient documentation

## 2020-04-11 DIAGNOSIS — R1010 Upper abdominal pain, unspecified: Secondary | ICD-10-CM | POA: Diagnosis not present

## 2020-04-11 DIAGNOSIS — R195 Other fecal abnormalities: Secondary | ICD-10-CM

## 2020-04-11 NOTE — Progress Notes (Signed)
Primary Care Physician:  Bridget Hartshorn, NP Primary Gastroenterologist:  Dr. Abbey Chatters  Chief Complaint  Patient presents with  . positive cologuard    HPI:   Natasha Massey is a 54 y.o. female who presents for positive Cologuard.  Reviewed information provided with referral including primary care office visit dated 01/23/2020 which was a visit for COPD follow-up.  Apparently a Cologuard was ordered at that time which resulted as positive.  She was subsequently referred to GI.  No history of colonoscopy found in our system.  Reviewed CT chest for COPD in the [place] via care everywhere.  Noted tiny hepatic cysts, consistent with known COPD, no other obvious upper abdominal findings.  Reviewed most recent CBC which is unremarkable, normal platelet count.  Recent CMP also unremarkable with normal LFTs and kidney function.  Today she states she's doing well overall. Has never had a colonoscopy before. Has some element of "terrible insomnia." Some intermittent mild abdominal discomfort upper abdomen but has COPD and has been attributed to this. Has some nausea but no vomiting. Does have a lot of nighttime GERD, sometimes in the day. Dexilant has helped, but still intermittent flares. Avoids trigger foods. Has nausea associated with GERD flare, but no vomiting. Denies hematochezia, melena, chills, unintentional weight loose. 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 Denies loss of sense of taste or smell. The patient has received COVID-19 vaccination(s). Denies chest pain, dyspnea, dizziness, lightheadedness, syncope, near syncope. Denies any other upper or lower GI symptoms.  Past Medical History:  Diagnosis Date  . Anxiety   . Asthma   . COPD (chronic obstructive pulmonary disease) ([place])   . GERD (gastroesophageal reflux  disease)   . Hypertension   . Neuropathy   . Pneumonia     Past Surgical History:  Procedure Laterality Date  . BACK SURGERY  2001   lumbar disc and cage  . BACTERIAL OVERGROWTH TEST N/A 06/26/2015   Procedure: BACTERIAL OVERGROWTH TEST;  Surgeon: Danie Binder, MD;  Location: [facility];  Service: Endoscopy;  Laterality: N/A;  0700  . BIOPSY N/A 09/11/2014   Procedure: DUODENAL AND GASTRIC BIOPSIES;  Surgeon: Danie Binder, MD;  Location: [facility] ORS;  Service: Endoscopy;  Laterality: N/A;  . ENDOMETRIAL ABLATION    . ESOPHAGOGASTRODUODENOSCOPY (EGD) WITH PROPOFOL N/A 09/11/2014   SLF: Normal esophageal mucosa. 2. Small hiatal hernia. 3. Moderate nonerosive erthyema and edema in the antraum. col forceps biopsies taken. 4. Mild erthyema and edema in teh duodenal bulb. Normal second portion of the duodenum. cold fordceps biopsies obtained to evaluate for eosinophilic gastritis. 5. Nausea and vomiting most likely due to reflux, gastrittis, and duodenitis.   . TUBAL LIGATION      Current Outpatient Medications  Medication Sig Dispense Refill  . amLODipine (NORVASC) 5 MG tablet Take 5 mg by mouth daily.    Marland Kitchen dexlansoprazole (DEXILANT) 60 MG capsule Take 1 capsule by mouth daily.    . diazepam (VALIUM) 5 MG tablet Take 5 mg by mouth 4 (four) times daily.    . ondansetron (ZOFRAN) 4 MG tablet TAKE 1 TABLET (4 MG TOTAL) BY MOUTH EVERY 8 (EIGHT) HOURS AS NEEDED FOR NAUSEA OR VOMITING. 20 tablet 0  . SYMBICORT 160-4.5 MCG/ACT inhaler Inhale 2 puffs into the lungs 2 (two) times daily.    . metroNIDAZOLE (FLAGYL)  500 MG tablet Take 1 tablet (500 mg total) by mouth 2 (two) times daily. FOR 5 DAYS (Patient not taking: Reported on 04/11/2020) 10 tablet 0   No current facility-administered medications for this visit.    Allergies as of 04/11/2020 - Review Complete 04/11/2020  Allergen Reaction Noted  . Amoxicillin Hives 07/30/2014  . Aspirin Hives 02/03/2008  . Bee venom  04/01/2017  . Betadine  [povidone iodine]  09/04/2014  . Penicillins Swelling 07/30/2014    Family History  Problem Relation Age of Onset  . Renal cancer Father   . Lung cancer Sister        Died age 61  . Lung cancer Brother 69  . Breast cancer Other        several maternal aunts  . Inflammatory bowel disease Other   . Heart attack Maternal Grandmother 75  . Colon cancer Neg Hx     Social History   Socioeconomic History  . Marital status: Divorced    Spouse name: Not on file  . Number of children: 2  . Years of education: Not on file  . Highest education level: Not on file  Occupational History  . Occupation: disability  Tobacco Use  . Smoking status: Current Every Day Smoker    Packs/day: 0.50    Years: 35.00    Pack years: 17.50    Types: Cigarettes  . Smokeless tobacco: Never Used  Vaping Use  . Vaping Use: Never used  Substance and Sexual Activity  . Alcohol use: No  . Drug use: No  . Sexual activity: Not Currently  Other Topics Concern  . Not on file  Social History Narrative   Lives at home with daughter and 3 grandchildren.   Social Determinants of Health   Financial Resource Strain:   . Difficulty of Paying Living Expenses: Not on file  Food Insecurity:   . Worried About Charity fundraiser in the Last Year: Not on file  . Ran Out of Food in the Last Year: Not on file  Transportation Needs:   . Lack of Transportation (Medical): Not on file  . Lack of Transportation (Non-Medical): Not on file  Physical Activity:   . Days of Exercise per Week: Not on file  . Minutes of Exercise per Session: Not on file  Stress:   . Feeling of Stress : Not on file  Social Connections:   . Frequency of Communication with Friends and Family: Not on file  . Frequency of Social Gatherings with Friends and Family: Not on file  . Attends Religious Services: Not on file  . Active Member of Clubs or Organizations: Not on file  . Attends Archivist Meetings: Not on file  . Marital  Status: Not on file  Intimate Partner Violence:   . Fear of Current or Ex-Partner: Not on file  . Emotionally Abused: Not on file  . Physically Abused: Not on file  . Sexually Abused: Not on file    Subjective: Review of Systems  Constitutional: Negative for chills, fever, malaise/fatigue and weight loss.       Insomnia  HENT: Negative for congestion and sore throat.   Respiratory: Negative for cough and shortness of breath.   Cardiovascular: Negative for chest pain and palpitations.  Gastrointestinal: Positive for abdominal pain. Negative for blood in stool, diarrhea, melena, nausea and vomiting.  Musculoskeletal: Negative for joint pain and myalgias.  Skin: Negative for rash.  Neurological: Negative for dizziness and weakness.  Endo/Heme/Allergies: Does  not bruise/bleed easily.  Psychiatric/Behavioral: Negative for depression. The patient is not nervous/anxious.   All other systems reviewed and are negative.      Objective: BP (!) 152/81   Pulse 84   Temp (!) 97.5 F (36.4 C) (Temporal)   Ht _0  (1.676 m)   Wt 189 lb 9.6 oz (86 kg)   BMI 30.60 kg/m  Physical Exam Vitals and nursing note reviewed.  Constitutional:      General: She is not in acute distress.    Appearance: Normal appearance. She is well-developed. She is not ill-appearing, toxic-appearing or diaphoretic.  HENT:     Head: Normocephalic and atraumatic.     Nose: No congestion or rhinorrhea.  Eyes:     General: No scleral icterus. Cardiovascular:     Rate and Rhythm: Normal rate and regular rhythm.     Heart sounds: Normal heart sounds.  Pulmonary:     Effort: Pulmonary effort is normal. No respiratory distress.     Breath sounds: Normal breath sounds.  Abdominal:     General: Bowel sounds are normal.     Palpations: Abdomen is soft. There is no hepatomegaly, splenomegaly or mass.     Tenderness: There is abdominal tenderness in the right upper quadrant, epigastric area and left upper quadrant.  There is no guarding or rebound.     Hernia: No hernia is present.     Comments: Mild discomfort  Skin:    General: Skin is warm and dry.     Coloration: Skin is not jaundiced.     Findings: No rash.  Neurological:     General: No focal deficit present.     Mental Status: She is alert and oriented to person, place, and time.  Psychiatric:        Attention and Perception: Attention normal.        Mood and Affect: Mood normal.        Speech: Speech normal.        Behavior: Behavior normal.        Thought Content: Thought content normal.        Cognition and Memory: Cognition and memory normal.      Assessment:  Pleasant 54 year old female who presents for positive Cologuard.  Noted history of COPD and upper abdominal pain.  Previous work-up indicated a couple tiny liver cysts unlikely cause of her discomfort.  She does have a history of GERD with associated nausea and is doing better on Dexilant compared to previous Prilosec.  Her discomfort has been attributed to her COPD, as per HPI.  She has never had a colonoscopy before.  We will plan for endoscopic evaluation based on positive Cologuard   Plan: 1. Colonoscopy on propofol/MAC due to positive Cologuard 2. Continue other current medications 3. Further recommendations to follow procedure 4. Follow-up based on post colonoscopy recommendations.   Thank you for allowing [place] to participate in the care of Thurman Coyer, DNP, AGNP-C Adult & Gerontological Nurse Practitioner [facility]  04/11/2020 8:40 AM   Disclaimer: This note was dictated with voice recognition software. Similar sounding words can inadvertently be transcribed and may not be corrected upon review.

## 2020-04-11 NOTE — Patient Instructions (Signed)
Your health issues we discussed today were:   Need for colonoscopy (positive Cologuard): 1. We will schedule your colonoscopy for you 2. Further recommendations will be made based on the results of your colonoscopy 3. Call us if you have any worsening or severe symptoms such as obvious bleeding into your stools  Overall I recommend:  1. Continue your other current medications 2. Return for follow-up based on recommendations made after your procedure 3. Call us if you have any questions or concerns   ---------------------------------------------------------------  I am glad you have gotten your COVID-19 vaccination!  Even though you are fully vaccinated you should continue to follow CDC and state/local guidelines.  ---------------------------------------------------------------   At [facility] we value your feedback. You may receive a survey about your visit today. Please share your experience as we strive to create trusting relationships with our patients to provide genuine, compassionate, quality care.  We appreciate your understanding and patience as we review any laboratory studies, imaging, and other diagnostic tests that are ordered as we care for you. Our office policy is 5 business days for review of these results, and any emergent or urgent results are addressed in a timely manner for your best interest. If you do not hear from our office in 1 week, please contact us.   We also encourage the use of MyChart, which contains your medical information for your review as well. If you are not enrolled in this feature, an access code is on this after visit summary for your convenience. Thank you for allowing [place] to be involved in your care.  It was great to see you today!  I hope you have a great rest of your summer!!

## 2020-05-16 ENCOUNTER — Other Ambulatory Visit: Payer: Self-pay

## 2020-05-16 ENCOUNTER — Telehealth: Payer: Self-pay | Admitting: Internal Medicine

## 2020-05-16 ENCOUNTER — Encounter (HOSPITAL_COMMUNITY): Payer: Self-pay

## 2020-05-16 NOTE — Telephone Encounter (Signed)
PLEASE RESEND PATIENT PREP PRESCRIPTION TO HER PHARMACY, THEY DO NOT HAVE IT

## 2020-05-16 NOTE — Telephone Encounter (Signed)
Patient is getting miralax and it's OTC. Pt aware

## 2020-05-17 ENCOUNTER — Encounter (HOSPITAL_COMMUNITY)
Admission: RE | Admit: 2020-05-17 | Discharge: 2020-05-17 | Disposition: A | Discharge status: Home / Self Care | Payer: Medicare Other | Source: Ambulatory Visit | Attending: Internal Medicine | Admitting: Internal Medicine

## 2020-05-17 ENCOUNTER — Other Ambulatory Visit (HOSPITAL_COMMUNITY)
Admission: RE | Admit: 2020-05-17 | Discharge: 2020-05-17 | Disposition: A | Discharge status: Home / Self Care | Payer: Medicare Other | Source: Ambulatory Visit | Attending: Internal Medicine | Admitting: Internal Medicine

## 2020-05-17 DIAGNOSIS — Z01812 Encounter for preprocedural laboratory examination: Secondary | ICD-10-CM | POA: Diagnosis present

## 2020-05-17 DIAGNOSIS — Z20822 Contact with and (suspected) exposure to covid-19: Secondary | ICD-10-CM | POA: Diagnosis not present

## 2020-05-17 LAB — SARS CORONAVIRUS 2 (TAT 6-24 HRS): SARS Coronavirus 2: NEGATIVE

## 2020-05-21 ENCOUNTER — Ambulatory Visit (HOSPITAL_COMMUNITY): Payer: Medicare Other | Admitting: Anesthesiology

## 2020-05-21 ENCOUNTER — Ambulatory Visit (HOSPITAL_COMMUNITY)
Admission: RE | Admit: 2020-05-21 | Discharge: 2020-05-21 | Disposition: A | Discharge status: Home / Self Care | Payer: Medicare Other | Attending: Internal Medicine | Admitting: Internal Medicine

## 2020-05-21 ENCOUNTER — Encounter (HOSPITAL_COMMUNITY)
Admission: RE | Disposition: A | Discharge status: Home / Self Care | Payer: Self-pay | Source: Home / Self Care | Attending: Internal Medicine

## 2020-05-21 DIAGNOSIS — Z888 Allergy status to other drugs, medicaments and biological substances status: Secondary | ICD-10-CM | POA: Diagnosis not present

## 2020-05-21 DIAGNOSIS — F419 Anxiety disorder, unspecified: Secondary | ICD-10-CM | POA: Diagnosis not present

## 2020-05-21 DIAGNOSIS — Z7951 Long term (current) use of inhaled steroids: Secondary | ICD-10-CM | POA: Insufficient documentation

## 2020-05-21 DIAGNOSIS — G629 Polyneuropathy, unspecified: Secondary | ICD-10-CM | POA: Diagnosis not present

## 2020-05-21 DIAGNOSIS — E039 Hypothyroidism, unspecified: Secondary | ICD-10-CM | POA: Diagnosis not present

## 2020-05-21 DIAGNOSIS — D124 Benign neoplasm of descending colon: Secondary | ICD-10-CM | POA: Diagnosis not present

## 2020-05-21 DIAGNOSIS — Z803 Family history of malignant neoplasm of breast: Secondary | ICD-10-CM | POA: Insufficient documentation

## 2020-05-21 DIAGNOSIS — D125 Benign neoplasm of sigmoid colon: Secondary | ICD-10-CM | POA: Insufficient documentation

## 2020-05-21 DIAGNOSIS — Z8379 Family history of other diseases of the digestive system: Secondary | ICD-10-CM | POA: Insufficient documentation

## 2020-05-21 DIAGNOSIS — G8929 Other chronic pain: Secondary | ICD-10-CM | POA: Insufficient documentation

## 2020-05-21 DIAGNOSIS — K219 Gastro-esophageal reflux disease without esophagitis: Secondary | ICD-10-CM | POA: Diagnosis not present

## 2020-05-21 DIAGNOSIS — Z8249 Family history of ischemic heart disease and other diseases of the circulatory system: Secondary | ICD-10-CM | POA: Diagnosis not present

## 2020-05-21 DIAGNOSIS — K648 Other hemorrhoids: Secondary | ICD-10-CM | POA: Insufficient documentation

## 2020-05-21 DIAGNOSIS — J449 Chronic obstructive pulmonary disease, unspecified: Secondary | ICD-10-CM | POA: Diagnosis not present

## 2020-05-21 DIAGNOSIS — Z886 Allergy status to analgesic agent status: Secondary | ICD-10-CM | POA: Insufficient documentation

## 2020-05-21 DIAGNOSIS — M549 Dorsalgia, unspecified: Secondary | ICD-10-CM | POA: Insufficient documentation

## 2020-05-21 DIAGNOSIS — R0602 Shortness of breath: Secondary | ICD-10-CM | POA: Insufficient documentation

## 2020-05-21 DIAGNOSIS — Z881 Allergy status to other antibiotic agents status: Secondary | ICD-10-CM | POA: Insufficient documentation

## 2020-05-21 DIAGNOSIS — Z801 Family history of malignant neoplasm of trachea, bronchus and lung: Secondary | ICD-10-CM | POA: Insufficient documentation

## 2020-05-21 DIAGNOSIS — I10 Essential (primary) hypertension: Secondary | ICD-10-CM | POA: Diagnosis not present

## 2020-05-21 DIAGNOSIS — F17200 Nicotine dependence, unspecified, uncomplicated: Secondary | ICD-10-CM | POA: Insufficient documentation

## 2020-05-21 DIAGNOSIS — R195 Other fecal abnormalities: Secondary | ICD-10-CM | POA: Insufficient documentation

## 2020-05-21 DIAGNOSIS — F17210 Nicotine dependence, cigarettes, uncomplicated: Secondary | ICD-10-CM | POA: Insufficient documentation

## 2020-05-21 DIAGNOSIS — Z91030 Bee allergy status: Secondary | ICD-10-CM | POA: Diagnosis not present

## 2020-05-21 DIAGNOSIS — Z7982 Long term (current) use of aspirin: Secondary | ICD-10-CM | POA: Insufficient documentation

## 2020-05-21 DIAGNOSIS — Z79899 Other long term (current) drug therapy: Secondary | ICD-10-CM | POA: Insufficient documentation

## 2020-05-21 DIAGNOSIS — K635 Polyp of colon: Secondary | ICD-10-CM

## 2020-05-21 DIAGNOSIS — Z880 Allergy status to penicillin: Secondary | ICD-10-CM | POA: Insufficient documentation

## 2020-05-21 HISTORY — PX: POLYPECTOMY: SHX5525

## 2020-05-21 HISTORY — PX: COLONOSCOPY WITH PROPOFOL: SHX5780

## 2020-05-21 SURGERY — COLONOSCOPY WITH PROPOFOL
Anesthesia: General

## 2020-05-21 MED ORDER — SODIUM CHLORIDE FLUSH 0.9 % IV SOLN
INTRAVENOUS | Status: AC
  Filled 2020-05-21: qty 10

## 2020-05-21 MED ORDER — LIDOCAINE HCL (CARDIAC) PF 50 MG/5ML IV SOSY
PREFILLED_SYRINGE | INTRAVENOUS | Status: DC | PRN
  Administered 2020-05-21: 100 mg via INTRAVENOUS

## 2020-05-21 MED ORDER — PROPOFOL 10 MG/ML IV BOLUS
INTRAVENOUS | Status: AC
  Filled 2020-05-21: qty 60

## 2020-05-21 MED ORDER — LACTATED RINGERS IV SOLN: INTRAVENOUS | Status: DC | PRN

## 2020-05-21 MED ORDER — LACTATED RINGERS IV SOLN: Freq: Once | INTRAVENOUS | Status: AC

## 2020-05-21 MED ORDER — SPOT INK MARKER SYRINGE KIT
PACK | SUBMUCOSAL | Status: DC | PRN
  Administered 2020-05-21: 5 mL via SUBMUCOSAL

## 2020-05-21 MED ORDER — STERILE WATER FOR IRRIGATION IR SOLN
Status: DC | PRN
  Administered 2020-05-21: 100 mL

## 2020-05-21 MED ORDER — SPOT INK MARKER SYRINGE KIT
PACK | SUBMUCOSAL | Status: AC
  Filled 2020-05-21: qty 5

## 2020-05-21 MED ORDER — PROPOFOL 10 MG/ML IV BOLUS
INTRAVENOUS | Status: DC | PRN
  Administered 2020-05-21: 100 mg via INTRAVENOUS
  Administered 2020-05-21: 150 ug/kg/min via INTRAVENOUS

## 2020-05-21 NOTE — Discharge Instructions (Addendum)
Monitored Anesthesia Care, Care After These instructions provide you with information about caring for yourself after your procedure. Your health care provider may also give you more specific instructions. Your treatment has been planned according to current medical practices, but problems sometimes occur. Call your health care provider if you have any problems or questions after your procedure. What can I expect after the procedure? After your procedure, you may:  Feel sleepy for several hours.  Feel clumsy and have poor balance for several hours.  Feel forgetful about what happened after the procedure.  Have poor judgment for several hours.  Feel nauseous or vomit.  Have a sore throat if you had a breathing tube during the procedure. Follow these instructions at home: For at least 24 hours after the procedure:      Have a responsible adult stay with you. It is important to have someone help care for you until you are awake and alert.  Rest as needed.  Do not: ? Participate in activities in which you could fall or become injured. ? Drive. ? Use heavy machinery. ? Drink alcohol. ? Take sleeping pills or medicines that cause drowsiness. ? Make important decisions or sign legal documents. ? Take care of children on your own. Eating and drinking  Follow the diet that is recommended by your health care provider.  If you vomit, drink water, juice, or soup when you can drink without vomiting.  Make sure you have little or no nausea before eating solid foods. General instructions  Take over-the-counter and prescription medicines only as told by your health care provider.  If you have sleep apnea, surgery and certain medicines can increase your risk for breathing problems. Follow instructions from your health care provider about wearing your sleep device: ? Anytime you are sleeping, including during daytime naps. ? While taking prescription pain medicines, sleeping medicines,  or medicines that make you drowsy.  If you smoke, do not smoke without supervision.  Keep all follow-up visits as told by your health care provider. This is important. Contact a health care provider if:  You keep feeling nauseous or you keep vomiting.  You feel light-headed.  You develop a rash.  You have a fever. Get help right away if:  You have trouble breathing. Summary  For several hours after your procedure, you may feel sleepy and have poor judgment.  Have a responsible adult stay with you for at least 24 hours or until you are awake and alert. This information is not intended to replace advice given to you by your health care provider. Make sure you discuss any questions you have with your health care provider. Document Revised: 11/08/2017 Document Reviewed: 12/01/2015 Elsevier Patient Education  [place]. Colonoscopy, Adult A colonoscopy is a procedure to look at the entire large intestine. This procedure is done using a long, thin, flexible tube that has a camera on the end. You may have a colonoscopy:  As a part of normal colorectal screening.  If you have certain symptoms, such as: ? A low number of red blood cells in your blood (anemia). ? Diarrhea that does not go away. ? Pain in your abdomen. ? Blood in your stool. A colonoscopy can help screen for and diagnose medical problems, including:  Tumors.  Extra tissue that grows where mucus forms (polyps).  Inflammation.  Areas of bleeding. Tell your health care provider about:  Any allergies you have.  All medicines you are taking, including vitamins, herbs, eye drops, creams,  and over-the-counter medicines.  Any problems you or family members have had with anesthetic medicines.  Any blood disorders you have.  Any surgeries you have had.  Any medical conditions you have.  Any problems you have had with having bowel movements.  Whether you are pregnant or may be pregnant. What are the  risks? Generally, this is a safe procedure. However, problems may occur, including:  Bleeding.  Damage to your intestine.  Allergic reactions to medicines given during the procedure.  Infection. This is rare. What happens before the procedure? Eating and drinking restrictions Follow instructions from your health care provider about eating or drinking restrictions, which may include:  A few days before the procedure: ? Follow a low-fiber diet. ? Avoid nuts, seeds, dried fruit, raw fruits, and vegetables.  1-3 days before the procedure: ? Eat only gelatin dessert or ice pops. ? Drink only clear liquids, such as water, clear juice, clear broth or bouillon, black coffee or tea, or clear soft drinks or sports drinks. ? Avoid liquids that contain red or purple dye.  The day of the procedure: ? Do not eat solid foods. You may continue to drink clear liquids until up to 2 hours before the procedure. ? Do not eat or drink anything starting 2 hours before the procedure, or within the time period that your health care provider recommends. Bowel prep If you were prescribed a bowel prep to take by mouth (orally) to clean out your colon:  Take it as told by your health care provider. Starting the day before your procedure, you will need to drink a large amount of liquid medicine. The liquid will cause you to have many bowel movements of loose stool until your stool becomes almost clear or light green.  If your skin or the opening between the buttocks (anus) gets irritated from diarrhea, you may relieve the irritation using: ? Wipes with medicine in them, such as adult wet wipes with aloe and vitamin E. ? A product to soothe skin, such as petroleum jelly.  If you vomit while drinking the bowel prep: ? Take a break for up to 60 minutes. ? Begin the bowel prep again. ? Call your health care provider if you keep vomiting or you cannot take the bowel prep without vomiting.  To clean out your  colon, you may also be given: ? Laxative medicines. These help you have a bowel movement. ? Instructions for enema use. An enema is liquid medicine injected into your rectum. Medicines Ask your health care provider about:  Changing or stopping your regular medicines or supplements. This is especially important if you are taking iron supplements, diabetes medicines, or blood thinners.  Taking medicines such as aspirin and ibuprofen. These medicines can thin your blood. Do not take these medicines unless your health care provider tells you to take them.  Taking over-the-counter medicines, vitamins, herbs, and supplements. General instructions  Ask your health care provider what steps will be taken to help prevent infection. These may include washing skin with a germ-killing soap.  Plan to have someone take you home from the hospital or clinic. What happens during the procedure?   An IV will be inserted into one of your veins.  You may be given one or more of the following: ? A medicine to help you relax (sedative). ? A medicine to numb the area (local anesthetic). ? A medicine to make you fall asleep (general anesthetic). This is rarely needed.  You will lie on your  side with your knees bent.  The tube will: ? Have oil or gel put on it (be lubricated). ? Be inserted into your anus. ? Be gently eased through all parts of your large intestine.  Air will be sent into your colon to keep it open. This may cause some pressure or cramping.  Images will be taken with the camera and will appear on a screen.  A small tissue sample may be removed to be looked at under a microscope (biopsy). The tissue may be sent to a lab for testing if any signs of problems are found.  If small polyps are found, they may be removed and checked for cancer cells.  When the procedure is finished, the tube will be removed. The procedure may vary among health care providers and hospitals. What happens after  the procedure?  Your blood pressure, heart rate, breathing rate, and blood oxygen level will be monitored until you leave the hospital or clinic.  You may have a small amount of blood in your stool.  You may pass gas and have mild cramping or bloating in your abdomen. This is caused by the air that was used to open your colon during the exam.  Do not drive for 24 hours after the procedure.  It is up to you to get the results of your procedure. Ask your health care provider, or the department that is doing the procedure, when your results will be ready. Summary  A colonoscopy is a procedure to look at the entire large intestine.  Follow instructions from your health care provider about eating and drinking before the procedure.  If you were prescribed an oral bowel prep to clean out your colon, take it as told by your health care provider.  During the colonoscopy, a flexible tube with a camera on its end is inserted into the anus and then passed into the other parts of the large intestine. This information is not intended to replace advice given to you by your health care provider. Make sure you discuss any questions you have with your health care provider. Document Revised: 03/03/2019 Document Reviewed: 03/03/2019 Elsevier Patient Education  [place].  Colonoscopy Discharge Instructions  Read the instructions outlined below and refer to this sheet in the next few weeks. These discharge instructions provide you with general information on caring for yourself after you leave the hospital. Your doctor may also give you specific instructions. While your treatment has been planned according to the most current medical practices available, unavoidable complications occasionally occur.   ACTIVITY  You may resume your regular activity, but move at a slower pace for the next 24 hours.   Take frequent rest periods for the next 24 hours.   Walking will help get rid of the air and  reduce the bloated feeling in your belly (abdomen).   No driving for 24 hours (because of the medicine (anesthesia) used during the test).    Do not sign any important legal documents or operate any machinery for 24 hours (because of the anesthesia used during the test).  NUTRITION  Drink plenty of fluids.   You may resume your normal diet as instructed by your doctor.   Begin with a light meal and progress to your normal diet. Heavy or fried foods are harder to digest and may make you feel sick to your stomach (nauseated).   Avoid alcoholic beverages for 24 hours or as instructed.  MEDICATIONS  You may resume your normal medications  unless your doctor tells you otherwise.  WHAT YOU CAN EXPECT TODAY  Some feelings of bloating in the abdomen.   Passage of more gas than usual.   Spotting of blood in your stool or on the toilet paper.  IF YOU HAD POLYPS REMOVED DURING THE COLONOSCOPY:  No aspirin products for 7 days or as instructed.   No alcohol for 7 days or as instructed.   Eat a soft diet for the next 24 hours.  FINDING OUT THE RESULTS OF YOUR TEST Not all test results are available during your visit. If your test results are not back during the visit, make an appointment with your caregiver to find out the results. Do not assume everything is normal if you have not heard from your caregiver or the medical facility. It is important for you to follow up on all of your test results.  SEEK IMMEDIATE MEDICAL ATTENTION IF:  You have more than a spotting of blood in your stool.   Your belly is swollen (abdominal distention).   You are nauseated or vomiting.   You have a temperature over 101.   You have abdominal pain or discomfort that is severe or gets worse throughout the day.   Your colonoscopy showed six polyps which I removed successfully.  Two of them were rather large.  Await pathology results, my office will contact you.  Depending on these results, we will need to  repeat colonoscopy in either 1 to 3 years.  Follow-up with GI as needed.  I hope you have a great rest of your week!  Elon Alas. Abbey Chatters, D.O. Gastroenterology and Hepatology [facility]

## 2020-05-21 NOTE — Anesthesia Preprocedure Evaluation (Addendum)
Anesthesia Evaluation  Patient identified by MRN, date of birth, ID band Patient awake    Reviewed: Allergy & Precautions, NPO status , Patient's Chart, lab work & pertinent test results  History of Anesthesia Complications Negative for: history of anesthetic complications  Airway Mallampati: I  TM Distance: >3 FB Neck ROM: Full    Dental  (+) Dental Advisory Given, Teeth Intact   Pulmonary shortness of breath, with exertion and at rest, asthma , pneumonia, resolved, COPD,  COPD inhaler, Current Smoker and Patient abstained from smoking.,    Pulmonary exam normal breath sounds clear to auscultation       Cardiovascular Exercise Tolerance: Good hypertension, Pt. on medications Normal cardiovascular exam Rhythm:Regular Rate:Normal     Neuro/Psych Anxiety    GI/Hepatic Neg liver ROS, GERD  Medicated and Controlled,  Endo/Other  Hypothyroidism   Renal/GU negative Renal ROS     Musculoskeletal Chronic back pain   Abdominal   Peds  Hematology negative hematology ROS (+)   Anesthesia Other Findings   Reproductive/Obstetrics negative OB ROS                            Anesthesia Physical Anesthesia Plan  ASA: III  Anesthesia Plan: General   Post-op Pain Management:    Induction: Intravenous  PONV Risk Score and Plan: TIVA  Airway Management Planned: Nasal Cannula and Natural Airway  Additional Equipment:   Intra-op Plan:   Post-operative Plan:   Informed Consent: I have reviewed the patients History and Physical, chart, labs and discussed the procedure including the risks, benefits and alternatives for the proposed anesthesia with the patient or authorized representative who has indicated his/her understanding and acceptance.       Plan Discussed with: CRNA and Surgeon  Anesthesia Plan Comments:        Anesthesia Quick Evaluation

## 2020-05-21 NOTE — Transfer of Care (Signed)
Immediate Anesthesia Transfer of Care Note  Patient: Natasha Massey  Procedure(s) Performed: COLONOSCOPY WITH PROPOFOL (N/A ) POLYPECTOMY  Patient Location: PACU  Anesthesia Type:General  Level of Consciousness: awake, alert , oriented and patient cooperative  Airway & Oxygen Therapy: Patient Spontanous Breathing  Post-op Assessment: Report given to RN, Post -op Vital signs reviewed and stable and Patient moving all extremities  Post vital signs: Reviewed and stable  Last Vitals:  Vitals Value Taken Time  BP    Temp    Pulse    Resp    SpO2      Last Pain:  Vitals:   05/21/20 0857  PainSc: 0-No pain         Complications: No complications documented.

## 2020-05-21 NOTE — Op Note (Signed)
[facility] Patient Name: Natasha Massey Procedure Date: 05/21/2020 8:46 AM MRN: [id number] Date of Birth: March 12, 1966 Attending MD: Elon Alas. Abbey Chatters DO CSN: [id number] Age: 54 Admit Type: Outpatient Procedure:                Colonoscopy Indications:              Positive Cologuard test Providers:                Elon Alas. Abbey Chatters, DO, Janeece Riggers, RN, Lambert Mody, Crystal Page Referring MD:              Medicines:                See the Anesthesia note for documentation of the                            administered medications Complications:            No immediate complications. Estimated Blood Loss:     Estimated blood loss was minimal. Procedure:                Pre-Anesthesia Assessment:                           - The anesthesia plan was to use monitored                            anesthesia care (MAC).                           After obtaining informed consent, the colonoscope                            was passed under direct vision. Throughout the                            procedure, the patient's blood pressure, pulse, and                            oxygen saturations were monitored continuously. The                            PCF-[id number] ([id number]) scope was introduced through                            the anus and advanced to the the cecum, identified                            by appendiceal orifice and ileocecal valve. The                            colonoscopy was performed without difficulty. The                            patient tolerated the procedure well. The quality  of the bowel preparation was evaluated using the                            BBPS [facility] Bowel Preparation Scale) with scores                            of: Right Colon = 3, Transverse Colon = 3 and Left                            Colon = 3 (entire mucosa seen well with no residual                            staining, small fragments of  stool or opaque                            liquid). The total BBPS score equals 9. Scope In: 8:59:32 AM Scope Out: 9:26:13 AM Scope Withdrawal Time: 0 hours 22 minutes 34 seconds  Total Procedure Duration: 0 hours 26 minutes 41 seconds  Findings:      The perianal and digital rectal examinations were normal.      Non-bleeding internal hemorrhoids were found during endoscopy.      A 10 mm polyp was found in the descending colon. The polyp was [place]       classification mixed IIa + IIc (superficial elevation with central       depression). Preparations were made for mucosal resection. Orise gel was       injected with adequate lift of the lesion from the muscularis propria.       Margins were well demarcated. Snare mucosal resection was performed. A       13 mm area was resected. Resection and retrieval were complete. There       was no bleeding during and at the end of the procedure. Area proximal       and distal was tattooed with an injection of Spot (carbon black).      Four sessile polyps were found in the descending colon. The polyps were       5 to 8 mm in size. These polyps were removed with a cold snare.       Resection and retrieval were complete.      A 13 mm polyp was found in the sigmoid colon. The polyp was sessile. The       polyp was removed with a hot snare. Resection and retrieval were       complete. Impression:               - Non-bleeding internal hemorrhoids.                           - One 10 mm polyp in the descending colon, removed                            with mucosal resection. Resected and retrieved.                            Tattooed.                           -  Four 5 to 8 mm polyps in the descending colon,                            removed with a cold snare. Resected and retrieved.                           - One 13 mm polyp in the sigmoid colon, removed                            with a hot snare. Resected and retrieved.                           -  Mucosal resection was performed. Resection and                            retrieval were complete. Moderate Sedation:      Per Anesthesia Care Recommendation:           - Patient has a contact number available for                            emergencies. The signs and symptoms of potential                            delayed complications were discussed with the                            patient. Return to normal activities tomorrow.                            Written discharge instructions were provided to the                            patient.                           - Resume previous diet.                           - Continue present medications.                           - Await pathology results.                           - Repeat colonoscopy date to be determined after                            pending pathology results are reviewed for                            surveillance based on pathology results.                           - Return to GI clinic [Return Day]. Procedure Code(s):        ---  Professional ---                           [phone], Colonoscopy, flexible; with endoscopic                            mucosal resection                           [id number], 59, Colonoscopy, flexible; with removal of                            tumor(s), polyp(s), or other lesion(s) by snare                            technique Diagnosis Code(s):        --- Professional ---                           K63.5, Polyp of colon                           K64.8, Other hemorrhoids                           R19.5, Other fecal abnormalities CPT copyright 2019 American Medical Association. All rights reserved. The codes documented in this report are preliminary and upon coder review may  be revised to meet current compliance requirements. Elon Alas. Abbey Chatters, DO [place] Abbey Chatters, DO 05/21/2020 9:34:29 AM This report has been signed electronically. Number of Addenda: 0

## 2020-05-21 NOTE — Anesthesia Postprocedure Evaluation (Signed)
Anesthesia Post Note  Patient: Natasha Massey  Procedure(s) Performed: COLONOSCOPY WITH PROPOFOL (N/A ) POLYPECTOMY  Anesthesia Type: General Level of consciousness: awake, oriented, awake and alert and patient cooperative Pain management: pain level controlled Vital Signs Assessment: post-procedure vital signs reviewed and stable Respiratory status: spontaneous breathing, respiratory function stable and nonlabored ventilation Cardiovascular status: blood pressure returned to baseline and stable Postop Assessment: no headache and no backache Anesthetic complications: no   No complications documented.   Last Vitals:  Vitals:   05/21/20 0815  BP: 111/75  Pulse: 67  Resp: 14  Temp: 36.7 C  SpO2: 96%    Last Pain:  Vitals:   05/21/20 0857  PainSc: 0-No pain                 Tacy Learn

## 2020-05-21 NOTE — H&P (Signed)
Primary Care Physician:  Bridget Hartshorn, NP Primary Gastroenterologist:  Dr. Abbey Chatters  Pre-Procedure History & Physical: HPI:  Natasha Massey is a 54 y.o. female is here for a diagnostic colonoscopy for positive cologuard   Past Medical History:  Diagnosis Date   Anxiety    Asthma    COPD (chronic obstructive pulmonary disease) ([place])    GERD (gastroesophageal reflux disease)    Hypertension    Neuropathy    Pneumonia     Past Surgical History:  Procedure Laterality Date   BACK SURGERY  2001   lumbar disc and cage   BACTERIAL OVERGROWTH TEST N/A 06/26/2015   Procedure: BACTERIAL OVERGROWTH TEST;  Surgeon: Danie Binder, MD;  Location: [facility] ENDO SUITE;  Service: Endoscopy;  Laterality: N/A;  0700   BIOPSY N/A 09/11/2014   Procedure: DUODENAL AND GASTRIC BIOPSIES;  Surgeon: Danie Binder, MD;  Location: [facility] ORS;  Service: Endoscopy;  Laterality: N/A;   ENDOMETRIAL ABLATION     ESOPHAGOGASTRODUODENOSCOPY (EGD) WITH PROPOFOL N/A 09/11/2014   SLF: Normal esophageal mucosa. 2. Small hiatal hernia. 3. Moderate nonerosive erthyema and edema in the antraum. col forceps biopsies taken. 4. Mild erthyema and edema in teh duodenal bulb. Normal second portion of the duodenum. cold fordceps biopsies obtained to evaluate for eosinophilic gastritis. 5. Nausea and vomiting most likely due to reflux, gastrittis, and duodenitis.    TUBAL LIGATION      Prior to Admission medications   Medication Sig Start Date End Date Taking? Authorizing Provider  acetaminophen (TYLENOL) 325 MG tablet Take 650 mg by mouth every 6 (six) hours as needed for moderate pain.   Yes [provider]  amLODipine (NORVASC) 5 MG tablet Take 5 mg by mouth daily. 01/24/20  Yes [provider]  dexlansoprazole (DEXILANT) 60 MG capsule Take 60 mg by mouth daily.  03/16/17  Yes [provider]  diazepam (VALIUM) 5 MG tablet Take 5 mg by mouth 4 (four) times daily.   Yes [provider]   ondansetron (ZOFRAN) 4 MG tablet TAKE 1 TABLET (4 MG TOTAL) BY MOUTH EVERY 8 (EIGHT) HOURS AS NEEDED FOR NAUSEA OR VOMITING. Patient taking differently: Take 4 mg by mouth every 8 (eight) hours as needed for nausea or vomiting.  08/07/16  Yes Carlis Stable, NP  SYMBICORT 160-4.5 MCG/ACT inhaler Inhale 2 puffs into the lungs 2 (two) times daily. 04/02/20  Yes [provider]    Allergies as of 04/11/2020 - Review Complete 04/11/2020  Allergen Reaction Noted   Amoxicillin Hives 07/30/2014   Aspirin Hives 02/03/2008   Bee venom  04/01/2017   Betadine [povidone iodine]  09/04/2014   Penicillins Swelling 07/30/2014    Family History  Problem Relation Age of Onset   Renal cancer Father    Lung cancer Sister        Died age 52   Lung cancer Brother 61   Breast cancer Other        several maternal aunts   Inflammatory bowel disease Other    Heart attack Maternal Grandmother 75   Colon cancer Neg Hx     Social History   Socioeconomic History   Marital status: Divorced    Spouse name: Not on file   Number of children: 2   Years of education: Not on file   Highest education level: Not on file  Occupational History   Occupation: disability  Tobacco Use   Smoking status: Current Every Day Smoker    Packs/day:  0.50    Years: 35.00    Pack years: 17.50    Types: Cigarettes   Smokeless tobacco: Never Used  Scientific laboratory technician Use: Never used  Substance and Sexual Activity   Alcohol use: No   Drug use: No   Sexual activity: Not Currently  Other Topics Concern   Not on file  Social History Narrative   Lives at home with daughter and 3 grandchildren.   Social Determinants of Health   Financial Resource Strain:    Difficulty of Paying Living Expenses: Not on file  Food Insecurity:    Worried About Charity fundraiser in the Last Year: Not on file   [employer] of Food in the Last Year: Not on file  Transportation Needs:    Lack of  Transportation (Medical): Not on file   Lack of Transportation (Non-Medical): Not on file  Physical Activity:    Days of Exercise per Week: Not on file   Minutes of Exercise per Session: Not on file  Stress:    Feeling of Stress : Not on file  Social Connections:    Frequency of Communication with Friends and Family: Not on file   Frequency of Social Gatherings with Friends and Family: Not on file   Attends Religious Services: Not on file   Active Member of Clubs or Organizations: Not on file   Attends Archivist Meetings: Not on file   Marital Status: Not on file  Intimate Partner Violence:    Fear of Current or Ex-Partner: Not on file   Emotionally Abused: Not on file   Physically Abused: Not on file   Sexually Abused: Not on file    Review of Systems: See HPI, otherwise negative ROS  Impression/Plan: VONETTA FOULK is here for a colonoscopy to be performed for positive cologuard  Risks, benefits, limitations, imponderables and alternatives regarding colonoscopy have been reviewed with the patient. Questions have been answered. All parties agreeable.

## 2020-05-22 LAB — SURGICAL PATHOLOGY

## 2020-05-23 ENCOUNTER — Telehealth: Payer: Self-pay | Admitting: Internal Medicine

## 2020-05-23 NOTE — Telephone Encounter (Signed)
Spoke with pt. Pt had her TCS on Tuesday 05/21/2020. Pt started having a fever(100.2) the night of procedure, Tuesday 05/21/2020. Pt has been taking Tylenol and fever will go down to 99.0. Pt has rt mid abdominal pain that goes around to her rt mid back. Pain started Wednesday. Pain level is a 7 wit 10 being the highest. Pt also reports ome nausea without vomiting. Pt has had bowel movements since her procedure and no visible blood seen.

## 2020-05-23 NOTE — Progress Notes (Signed)
Called to follow up with patient for post-op call, patient stated she has had low grade temperature of 100.35F for the last two days and pain in right abdomen that wraps to her back. I advised patient to call MD office immediately to make them aware.

## 2020-05-23 NOTE — Telephone Encounter (Signed)
Pt had procedure on 05/21/2020 by Dr Abbey Chatters. She said she is having abdominal pain and has a fever of 100.1. She said the hospital told her to call us. Please advise. [phone]

## 2020-05-27 ENCOUNTER — Encounter (HOSPITAL_COMMUNITY): Payer: Self-pay | Admitting: Internal Medicine

## 2020-05-27 NOTE — Telephone Encounter (Signed)
Noted  

## 2020-05-27 NOTE — Telephone Encounter (Signed)
Thank you for letting me know.  I called patient today to check on her, she is feeling better.

## 2023-01-12 ENCOUNTER — Ambulatory Visit (HOSPITAL_COMMUNITY)
Admission: RE | Admit: 2023-01-12 | Discharge: 2023-01-12 | Disposition: A | Discharge status: Home / Self Care | Payer: Medicare HMO | Source: Ambulatory Visit | Attending: Adult Health Nurse Practitioner | Admitting: Adult Health Nurse Practitioner

## 2023-01-12 ENCOUNTER — Other Ambulatory Visit (HOSPITAL_COMMUNITY): Payer: Self-pay | Admitting: Adult Health Nurse Practitioner

## 2023-01-12 DIAGNOSIS — R0781 Pleurodynia: Secondary | ICD-10-CM

## 2023-04-05 ENCOUNTER — Encounter: Payer: Self-pay | Admitting: *Deleted

## 2023-07-16 ENCOUNTER — Encounter: Payer: Self-pay | Admitting: *Deleted

## 2023-08-30 ENCOUNTER — Encounter: Payer: Self-pay | Admitting: Gastroenterology

## 2023-08-30 ENCOUNTER — Ambulatory Visit (INDEPENDENT_AMBULATORY_CARE_PROVIDER_SITE_OTHER): Payer: Medicare HMO | Admitting: Gastroenterology

## 2023-08-30 VITALS — BP 124/83 | HR 82 | Temp 97.9°F | Ht 66.0 in | Wt 186.0 lb

## 2023-08-30 DIAGNOSIS — Z860101 Personal history of adenomatous and serrated colon polyps: Secondary | ICD-10-CM

## 2023-08-30 DIAGNOSIS — R140 Abdominal distension (gaseous): Secondary | ICD-10-CM

## 2023-08-30 DIAGNOSIS — R197 Diarrhea, unspecified: Secondary | ICD-10-CM

## 2023-08-30 DIAGNOSIS — K529 Noninfective gastroenteritis and colitis, unspecified: Secondary | ICD-10-CM

## 2023-08-30 DIAGNOSIS — Z860100 Personal history of colon polyps, unspecified: Secondary | ICD-10-CM

## 2023-08-30 NOTE — Patient Instructions (Signed)
 We will get you scheduled for colonoscopy in the near future with Dr. Cindie.  To help with the diarrhea you could try a daily fiber supplement such as Benefiber or Metamucil.  I recommend 1 tablespoon daily.  You may use Gas-X or Phazyme as needed for bloating or excessive gassiness.  You may have some transient increase of this with fiber.  Avoid gas-producing foods (eg, garlic, cabbage, legumes, beans, onions, broccoli, brussel sprouts, wheat, and potatoes).   Follow-up will be determined after your colonoscopy.  It was a pleasure to see you today. I want to create trusting relationships with patients. If you receive a survey regarding your visit,  I greatly appreciate you taking time to fill this out on paper or through your MyChart. I value your feedback.  Charmaine Melia, MSN, FNP-BC, AGACNP-BC [facility]

## 2023-08-30 NOTE — Progress Notes (Signed)
 GI Office Note    Referring Provider: Baird Comer GAILS, NP Primary Care Physician:  Baird Comer GAILS, NP  Primary Gastroenterologist: Carlin POUR. Cindie, DO  Chief Complaint   Chief Complaint  Patient presents with   Colonoscopy    Colonoscopy screening    History of Present Illness   Natasha Massey is a 58 y.o. female presenting today at the request of Hemberg, Comer GAILS, NP for evaluation prior to scheduling surveillance colonoscopy.  Last seen in our office in August 2021 for evaluation of positive Cologuard.  She had recent unremarkable lab results.  She reported doing well overall and never had a colonoscopy.  She did report intermittent mid abdominal discomfort which she was attributing to her COPD.  Does have a lot of nighttime reflux and sometimes in the day although Dexilant had helped.  Has not associated with her GERD flares but no vomiting.  She reported intermittent chronic fevers which had been attributed to sacks of scar tissue in her lungs.  She denied any significant upper or lower GI symptoms other than what was mentioned.  She was scheduled for colonoscopy and advised continue her current medications..   Colonoscopy 05/21/20: -Nonbleeding internal hemorrhoids -10 mm polyp in the descending colon -Four 5-8 mm polyps in the descending colon -13 mm polyp in the sigmoid colon -Path: Tubular adenomas and benign lymphoid aggregates. -3-year surveillance advised.  Today:  Has COPD/emphysema. She gets chest tightness and shortness of breath with anger. Has a lot of life stressors right now. Son passed in May. Has had some brief increases in BP but usually occurs with more stressful times.   Takes valium 4 times daily as well as her Wellbutrin. Does not cause her any sleepiness.   Paternal uncle with colon cancer in his early 40s - passed at age 26. No other family hx of polyps or colon cancer.   She does have some bloating that occurs and some discomfort  and achiness related to this. Has not had a solid BM in about a year. Usually goes 2-3 times in the morning and always loose and sometimes urgent. No nocturnal stools. Has had some mild brbpr but no melena. Mostly in the commode but looks darker in the commode. This is not with every BM. Does not strain. To her knowledge she has never had hemorrhoids. Has had some weight gain. Eats only 1 meal per day. Has not had dairy products in about 5 years. Has not tried gas ex in a log time to help.   Has only very mild GERD, no medications.   She took care of her Dad for 14 years and he passed shortly before her son.   Wt Readings from Last 3 Encounters:  08/30/23 186 lb (84.4 kg)  05/16/20 175 lb (79.4 kg)  04/11/20 189 lb 9.6 oz (86 kg)    Current Outpatient Medications  Medication Sig Dispense Refill   acetaminophen  (TYLENOL ) 325 MG tablet Take 650 mg by mouth every 6 (six) hours as needed for moderate pain.     buPROPion (WELLBUTRIN) 75 MG tablet Take 75 mg by mouth 2 (two) times daily.     diazepam (VALIUM) 5 MG tablet Take 5 mg by mouth 4 (four) times daily.     ondansetron  (ZOFRAN ) 4 MG tablet TAKE 1 TABLET (4 MG TOTAL) BY MOUTH EVERY 8 (EIGHT) HOURS AS NEEDED FOR NAUSEA OR VOMITING. (Patient taking differently: Take 4 mg by mouth every 8 (eight) hours as needed for  nausea or vomiting.) 20 tablet 0   SYMBICORT 160-4.5 MCG/ACT inhaler Inhale 2 puffs into the lungs 2 (two) times daily.     amLODipine (NORVASC) 5 MG tablet Take 5 mg by mouth daily. (Patient not taking: Reported on 08/30/2023)     dexlansoprazole (DEXILANT) 60 MG capsule Take 60 mg by mouth daily.  (Patient not taking: Reported on 08/30/2023)     No current facility-administered medications for this visit.    Past Medical History:  Diagnosis Date   Anxiety    Asthma    COPD (chronic obstructive pulmonary disease) (HCC)    GERD (gastroesophageal reflux disease)    Hypertension    Neuropathy    Pneumonia     Past Surgical  History:  Procedure Laterality Date   BACK SURGERY  2001   lumbar disc and cage   BACTERIAL OVERGROWTH TEST N/A 06/26/2015   Procedure: BACTERIAL OVERGROWTH TEST;  Surgeon: Margo LITTIE Haddock, MD;  Location: [facility];  Service: Endoscopy;  Laterality: N/A;  0700   BIOPSY N/A 09/11/2014   Procedure: DUODENAL AND GASTRIC BIOPSIES;  Surgeon: Margo LITTIE Haddock, MD;  Location: [facility];  Service: Endoscopy;  Laterality: N/A;   COLONOSCOPY WITH PROPOFOL  N/A 05/21/2020   Procedure: COLONOSCOPY WITH PROPOFOL ;  Surgeon: Cindie Carlin POUR, DO;  Location: [facility];  Service: Endoscopy;  Laterality: N/A;  9:30am   ENDOMETRIAL ABLATION     ESOPHAGOGASTRODUODENOSCOPY (EGD) WITH PROPOFOL  N/A 09/11/2014   SLF: Normal esophageal mucosa. 2. Small hiatal hernia. 3. Moderate nonerosive erthyema and edema in the antraum. col forceps biopsies taken. 4. Mild erthyema and edema in teh duodenal bulb. Normal second portion of the duodenum. cold fordceps biopsies obtained to evaluate for eosinophilic gastritis. 5. Nausea and vomiting most likely due to reflux, gastrittis, and duodenitis.    POLYPECTOMY  05/21/2020   Procedure: POLYPECTOMY;  Surgeon: Cindie Carlin POUR, DO;  Location: [facility];  Service: Endoscopy;;   TUBAL LIGATION      Family History  Problem Relation Age of Onset   Renal cancer Father    Lung cancer Sister        Died age 94   Lung cancer Brother 14   Breast cancer Other        several maternal aunts   Inflammatory bowel disease Other    Heart attack Maternal Grandmother 24   Colon cancer Neg Hx     Allergies as of 08/30/2023 - Review Complete 08/30/2023  Allergen Reaction Noted   Bee venom Anaphylaxis 04/01/2017   Amoxicillin Hives 07/30/2014   Aspirin Hives 02/03/2008   Penicillins Swelling 07/30/2014   Betadine [povidone iodine] Rash 09/04/2014    Social History   Socioeconomic History   Marital status: Divorced    Spouse name: Not on file   Number of children: 2   Years of  education: Not on file   Highest education level: Not on file  Occupational History   Occupation: disability  Tobacco Use   Smoking status: Every Day    Current packs/day: 0.50    Average packs/day: 0.5 packs/day for 35.0 years (17.5 ttl pk-yrs)    Types: Cigarettes   Smokeless tobacco: Never  Vaping Use   Vaping status: Never Used  Substance and Sexual Activity   Alcohol use: No   Drug use: No   Sexual activity: Not Currently  Other Topics Concern   Not on file  Social History Narrative   Lives at home with daughter and 3 grandchildren.  Social Drivers of Corporate Investment Banker Strain: High Risk (05/24/2023)   Received from [facility]   Overall Financial Resource Strain (CARDIA)    Difficulty of Paying Living Expenses: Very hard  Food Insecurity: No Food Insecurity (05/24/2023)   Received from [facility]   Hunger Vital Sign    Worried About Running Out of Food in the Last Year: Never true    Ran Out of Food in the Last Year: Never true  Transportation Needs: No Transportation Needs (05/24/2023)   Received from [facility] - Transportation    Lack of Transportation (Medical): No    Lack of Transportation (Non-Medical): No  Physical Activity: Inactive (05/24/2023)   [facility] from [facility]   Exercise Vital Sign    Days of Exercise per Week: 0 days    Minutes of Exercise per Session: 30 min  Stress: Stress Concern Present (05/24/2023)   Received from [facility] - Occupational Stress Questionnaire    Feeling of Stress : Rather much  Social Connections: Socially Integrated (05/24/2023)   Received from [facility]   Social Network    How would you rate your social network (family, work, friends)?: Good participation with social networks  Intimate Partner Violence: Not At Risk (05/24/2023)   Received from [facility]   HITS    Over the last 12 months how often did your partner physically hurt you?:  Never    Over the last 12 months how often did your partner insult you or talk down to you?: Never    Over the last 12 months how often did your partner threaten you with physical harm?: Never    Over the last 12 months how often did your partner scream or curse at you?: Never     Review of Systems   Gen: Denies any fever, chills, fatigue, weight loss, lack of appetite.  CV: Denies chest pain, heart palpitations, peripheral edema, syncope.  Resp: Denies shortness of breath at rest or with exertion. Denies wheezing or cough.  GI: see HPI GU : Denies urinary burning, urinary frequency, urinary hesitancy MS: Denies joint pain, muscle weakness, cramps, or limitation of movement.  Derm: Denies rash, itching, dry skin Psych: Denies depression, anxiety, memory loss, and confusion Heme: Denies bruising, bleeding, and enlarged lymph nodes.  Physical Exam   BP 124/83 (BP Location: Right Arm, Patient Position: Sitting, Cuff Size: Large)   Pulse 82   Temp 97.9 F (36.6 C) (Temporal)   Ht 5' 6 (1.676 m)   Wt 186 lb (84.4 kg)   BMI 30.02 kg/m   General:   Alert and oriented. Pleasant and cooperative. Well-nourished and well-developed.  Head:  Normocephalic and atraumatic. Eyes:  Without icterus, sclera clear and conjunctiva pink.  Ears:  Normal auditory acuity. Mouth:  No deformity or lesions, oral mucosa pink.  Lungs:  Clear to auscultation bilaterally. No wheezes, rales, or rhonchi. No distress.  Heart:  S1, S2 present without murmurs appreciated.  Abdomen:  +BS, soft, non-tender and non-distended. No HSM noted. No guarding or rebound. No masses appreciated.  Rectal:  Deferred  Msk:  Symmetrical without gross deformities. Normal posture. Extremities:  Without edema. Neurologic:  Alert and  oriented x4;  grossly normal neurologically. Skin:  Intact without significant lesions or rashes. Psych:  Alert and cooperative. Normal mood and affect.  Assessment   Natasha Massey is a 58  y.o. female with a history of anxiety, GERD,  COPD, and HTN presenting today for evaluation prior to scheduling surveillance colonoscopy and with reports of diarrhea.  History of colon polyps: Has had diarrhea for about a year as noted below.  Otherwise she has no other alarm symptoms and no nocturnal stools.  Weight has been stable.  Last colonoscopy in September 2021 with 5 benign polyps and tubular adenomas removed from the descending and sigmoid colon.  Given the size and pathology of her polyps she was recommended to have a 3-year follow-up, currently overdue.  Diarrhea/bloating: Has been experiencing some bloating as well as an achiness to her abdomen and mushy stools for about the last year.  She has also been under significant amounts of stress and grief with the loss of her dad as well as her son which could be contributing to her change in bowel habits as well as her bloating.  Also dietary factors could be playing a role.  Advised her to continue to follow a lactose-free diet.  Advised to trial fiber supplementation to see if this helps with bulking up stools.  She may use Gas-X or Phazyme for bloating/gassiness.  Could consider ruling out celiac if symptoms continue.  PLAN   Proceed with colonoscopy with propofol  by Dr. Cindie  in near future: the risks, benefits, and alternatives have been discussed with the patient in detail. The patient states understanding and desires to proceed. ASA 3 Avoid common gas producing foods.  Gas ex or Phazyme as needed Start fiber supplement - 1 Tbl daily.  Could consider workup for celiac if ongoing symptoms Follow up as needed   Charmaine Melia, MSN, FNP-BC, AGACNP-BC [facility]

## 2023-09-01 ENCOUNTER — Telehealth: Payer: Self-pay | Admitting: *Deleted

## 2023-09-01 NOTE — Telephone Encounter (Signed)
 Called, LMTCB to schedule TCS with Dr. Marletta Lor, ASA 3, hx colon polyps

## 2023-09-09 NOTE — Telephone Encounter (Signed)
 LMTCB. Letter mailed ?

## 2023-09-27 ENCOUNTER — Other Ambulatory Visit: Payer: Self-pay | Admitting: *Deleted

## 2023-09-27 ENCOUNTER — Encounter: Payer: Self-pay | Admitting: *Deleted

## 2023-09-27 MED ORDER — PEG 3350-KCL-NA BICARB-NACL 420 G PO SOLR: 4000.0000 mL | Freq: Once | ORAL | 0 refills | Status: AC

## 2023-09-27 NOTE — Telephone Encounter (Signed)
Pt has been scheduled for 10/25/23. Instructions mailed and prep sent to the pharmacy

## 2023-10-19 NOTE — Telephone Encounter (Signed)
 [employer] PA: Approved Authorization #[id number]  Tracking [id number] Dates of service 09/27/2023 - 12/24/2023

## 2023-10-20 NOTE — Patient Instructions (Signed)
   Your procedure is scheduled on: 10/25/2023  Report to [facility] Main Entrance at   7:00  AM.  Call this number if you have problems the morning of surgery: [phone]   Remember:              Follow Directions on the letter you received from Your Physician's office regarding the Bowel Prep              No Smoking the day of Procedure :   Take these medicines the morning of surgery with A SIP OF WATER: Wellbutrin, Valium and symbicort   Do not wear jewelry, make-up or nail polish.    Do not bring valuables to the hospital.  Contacts, dentures or bridgework may not be worn into surgery.  .   Patients discharged the day of surgery will not be allowed to drive home.     Colonoscopy, Adult, Care After This sheet gives you information about how to care for yourself after your procedure. Your health care provider may also give you more specific instructions. If you have problems or questions, contact your health care provider. What can I expect after the procedure? After the procedure, it is common to have: A small amount of blood in your stool for 24 hours after the procedure. Some gas. Mild abdominal cramping or bloating.  Follow these instructions at home: General instructions  For the first 24 hours after the procedure: Do not drive or use machinery. Do not sign important documents. Do not drink alcohol. Do your regular daily activities at a slower pace than normal. Eat soft, easy-to-digest foods. Rest often. Take over-the-counter or prescription medicines only as told by your health care provider. It is up to you to get the results of your procedure. Ask your health care provider, or the department performing the procedure, when your results will be ready. Relieving cramping and bloating Try walking around when you have cramps or feel bloated. Apply heat to your abdomen as told by your health care provider. Use a heat source that your health care provider recommends, such  as a moist heat pack or a heating pad. Place a towel between your skin and the heat source. Leave the heat on for 20-30 minutes. Remove the heat if your skin turns bright red. This is especially important if you are unable to feel pain, heat, or cold. You may have a greater risk of getting burned. Eating and drinking Drink enough fluid to keep your urine clear or pale yellow. Resume your normal diet as instructed by your health care provider. Avoid heavy or fried foods that are hard to digest. Avoid drinking alcohol for as long as instructed by your health care provider. Contact a health care provider if: You have blood in your stool 2-3 days after the procedure. Get help right away if: You have more than a small spotting of blood in your stool. You pass large blood clots in your stool. Your abdomen is swollen. You have nausea or vomiting. You have a fever. You have increasing abdominal pain that is not relieved with medicine. This information is not intended to replace advice given to you by your health care provider. Make sure you discuss any questions you have with your health care provider. Document Released: 03/24/2004 Document Revised: 05/04/2016 Document Reviewed: 10/22/2015 Elsevier Interactive Patient Education  [employer].

## 2023-10-21 NOTE — Telephone Encounter (Signed)
 Pt left vm stating her procedure is on 10/25/23 and prep has not been sent in to pharmacy.  Called pharmacy and they did receive the prescription and will be filling it now. LMOVM to pt that prescription was being filled. Any questions to call back

## 2023-10-22 ENCOUNTER — Encounter (HOSPITAL_COMMUNITY): Payer: Self-pay | Admitting: *Deleted

## 2023-10-22 ENCOUNTER — Encounter (HOSPITAL_COMMUNITY)
Admission: RE | Admit: 2023-10-22 | Discharge: 2023-10-22 | Disposition: A | Discharge status: Home / Self Care | Payer: Medicare HMO | Source: Ambulatory Visit | Attending: Internal Medicine | Admitting: Internal Medicine

## 2023-10-22 DIAGNOSIS — Z01818 Encounter for other preprocedural examination: Secondary | ICD-10-CM

## 2023-10-22 DIAGNOSIS — I10 Essential (primary) hypertension: Secondary | ICD-10-CM

## 2023-10-25 ENCOUNTER — Encounter (HOSPITAL_COMMUNITY)
Admission: RE | Disposition: A | Discharge status: Home / Self Care | Payer: Self-pay | Source: Home / Self Care | Attending: Internal Medicine

## 2023-10-25 ENCOUNTER — Ambulatory Visit (HOSPITAL_COMMUNITY): Admitting: Anesthesiology

## 2023-10-25 ENCOUNTER — Ambulatory Visit (HOSPITAL_COMMUNITY)
Admission: RE | Admit: 2023-10-25 | Discharge: 2023-10-25 | Disposition: A | Discharge status: Home / Self Care | Payer: Medicare HMO | Attending: Internal Medicine | Admitting: Internal Medicine

## 2023-10-25 ENCOUNTER — Encounter (HOSPITAL_COMMUNITY): Payer: Self-pay | Admitting: Internal Medicine

## 2023-10-25 DIAGNOSIS — I10 Essential (primary) hypertension: Secondary | ICD-10-CM | POA: Diagnosis not present

## 2023-10-25 DIAGNOSIS — Z5986 Financial insecurity: Secondary | ICD-10-CM | POA: Insufficient documentation

## 2023-10-25 DIAGNOSIS — K635 Polyp of colon: Secondary | ICD-10-CM | POA: Insufficient documentation

## 2023-10-25 DIAGNOSIS — Z860101 Personal history of adenomatous and serrated colon polyps: Secondary | ICD-10-CM | POA: Diagnosis present

## 2023-10-25 DIAGNOSIS — J449 Chronic obstructive pulmonary disease, unspecified: Secondary | ICD-10-CM | POA: Insufficient documentation

## 2023-10-25 DIAGNOSIS — K219 Gastro-esophageal reflux disease without esophagitis: Secondary | ICD-10-CM | POA: Insufficient documentation

## 2023-10-25 DIAGNOSIS — Z860100 Personal history of colon polyps, unspecified: Secondary | ICD-10-CM

## 2023-10-25 DIAGNOSIS — Z1211 Encounter for screening for malignant neoplasm of colon: Secondary | ICD-10-CM

## 2023-10-25 DIAGNOSIS — D123 Benign neoplasm of transverse colon: Secondary | ICD-10-CM

## 2023-10-25 DIAGNOSIS — Z8249 Family history of ischemic heart disease and other diseases of the circulatory system: Secondary | ICD-10-CM | POA: Diagnosis not present

## 2023-10-25 DIAGNOSIS — K649 Unspecified hemorrhoids: Secondary | ICD-10-CM

## 2023-10-25 DIAGNOSIS — K648 Other hemorrhoids: Secondary | ICD-10-CM | POA: Insufficient documentation

## 2023-10-25 DIAGNOSIS — F17210 Nicotine dependence, cigarettes, uncomplicated: Secondary | ICD-10-CM | POA: Diagnosis not present

## 2023-10-25 DIAGNOSIS — F419 Anxiety disorder, unspecified: Secondary | ICD-10-CM | POA: Diagnosis not present

## 2023-10-25 HISTORY — PX: COLONOSCOPY WITH PROPOFOL: SHX5780

## 2023-10-25 HISTORY — PX: POLYPECTOMY: SHX5525

## 2023-10-25 SURGERY — COLONOSCOPY WITH PROPOFOL
Anesthesia: General

## 2023-10-25 MED ORDER — LACTATED RINGERS IV SOLN: INTRAVENOUS | Status: DC | PRN

## 2023-10-25 MED ORDER — PROPOFOL 10 MG/ML IV BOLUS
INTRAVENOUS | Status: DC | PRN
  Administered 2023-10-25: 100 mg via INTRAVENOUS
  Administered 2023-10-25: 150 ug/kg/min via INTRAVENOUS
  Administered 2023-10-25: 30 mg via INTRAVENOUS

## 2023-10-25 MED ORDER — LACTATED RINGERS IV SOLN: INTRAVENOUS | Status: DC

## 2023-10-25 NOTE — Op Note (Signed)
 [facility] Patient Name: Natasha Massey Procedure Date: 10/25/2023 8:17 AM MRN: [id number] Date of Birth: 1965-09-09 Attending MD: Hennie Duos. Marletta Lor , [place], [id number] CSN: [id number] Age: 58 Admit Type: Outpatient Procedure:                Colonoscopy Indications:              Surveillance: Personal history of adenomatous                            polyps on last colonoscopy 3 years ago Providers:                Hennie Duos. Marletta Lor, DO, Emilee Tubb RN, RN, Lafonda Mosses, Technician Referring MD:              Medicines:                See the Anesthesia note for documentation of the                            administered medications Complications:            No immediate complications. Estimated Blood Loss:     Estimated blood loss was minimal. Procedure:                Pre-Anesthesia Assessment:                           - The anesthesia plan was to use monitored                            anesthesia care (MAC).                           After obtaining informed consent, the colonoscope                            was passed under direct vision. Throughout the                            procedure, the patient's blood pressure, pulse, and                            oxygen saturations were monitored continuously. The                            [id number]-HQ190L ([id number]) scope was introduced through                            the anus and advanced to the the cecum, identified                            by appendiceal orifice and ileocecal valve. The                            colonoscopy  was performed without difficulty. The                            patient tolerated the procedure well. The quality                            of the bowel preparation was evaluated using the                            BBPS [facility] Bowel Preparation Scale) with scores                            of: Right Colon = 3, Transverse Colon = 3 and Left                             Colon = 3 (entire mucosa seen well with no residual                            staining, small fragments of stool or opaque                            liquid). The total BBPS score equals 9. Scope In: 8:33:33 AM Scope Out: 8:55:31 AM Scope Withdrawal Time: 0 hours 20 minutes 12 seconds  Total Procedure Duration: 0 hours 21 minutes 58 seconds  Findings:      Non-bleeding internal hemorrhoids were found during retroflexion.      A tattoo was seen in the descending colon. The tattoo site appeared       normal.      Eight sessile polyps were found in the transverse colon. The polyps were       4 to 7 mm in size. These polyps were removed with a cold snare.       Resection and retrieval were complete.      Five sessile polyps were found in the sigmoid colon. The polyps were 4       to 6 mm in size. These polyps were removed with a cold snare. Resection       and retrieval were complete.      The exam was otherwise without abnormality. Impression:               - Non-bleeding internal hemorrhoids.                           - A tattoo was seen in the descending colon. The                            tattoo site appeared normal.                           - Eight 4 to 7 mm polyps in the transverse colon,                            removed with a cold snare. Resected and retrieved.                           -  Five 4 to 6 mm polyps in the sigmoid colon,                            removed with a cold snare. Resected and retrieved.                           - The examination was otherwise normal. Moderate Sedation:      Per Anesthesia Care Recommendation:           - Patient has a contact number available for                            emergencies. The signs and symptoms of potential                            delayed complications were discussed with the                            patient. Return to normal activities tomorrow.                            Written discharge instructions were provided  to the                            patient.                           - Resume previous diet.                           - Continue present medications.                           - Await pathology results.                           - Repeat colonoscopy date to be determined after                            pending pathology results are reviewed for                            surveillance.                           - Return to GI clinic PRN. Procedure Code(s):        --- Professional ---                           [phone], Colonoscopy, flexible; with removal of                            tumor(s), polyp(s), or other lesion(s) by snare                            technique Diagnosis Code(s):        ---  Professional ---                           Z86.010, Personal history of colonic polyps                           K64.8, Other hemorrhoids                           D12.3, Benign neoplasm of transverse colon (hepatic                            flexure or splenic flexure)                           D12.5, Benign neoplasm of sigmoid colon CPT copyright 2022 American Medical Association. All rights reserved. The codes documented in this report are preliminary and upon coder review may  be revised to meet current compliance requirements. Hennie Duos. Marletta Lor, DO Hennie Duos. Marletta Lor, DO 10/25/2023 8:59:14 AM This report has been signed electronically. Number of Addenda: 0

## 2023-10-25 NOTE — H&P (Signed)
 Primary Care Physician:  Rebecka Apley, NP Primary Gastroenterologist:  Dr. Marletta Lor  Pre-Procedure History & Physical: HPI:  Natasha Massey is a 58 y.o. female is here for a colonoscopy to be performed for surveillance purposes, personal history of adenomatous colon polyps in 2021  Past Medical History:  Diagnosis Date   Anxiety    COPD (chronic obstructive pulmonary disease) (HCC)    GERD (gastroesophageal reflux disease)    Hypertension    Neuropathy    Pneumonia     Past Surgical History:  Procedure Laterality Date   BACK SURGERY  2001   lumbar disc and cage   BACTERIAL OVERGROWTH TEST N/A 06/26/2015   Procedure: BACTERIAL OVERGROWTH TEST;  Surgeon: West Bali, MD;  Location: [facility];  Service: Endoscopy;  Laterality: N/A;  0700   BIOPSY N/A 09/11/2014   Procedure: DUODENAL AND GASTRIC BIOPSIES;  Surgeon: West Bali, MD;  Location: [facility];  Service: Endoscopy;  Laterality: N/A;   COLONOSCOPY WITH PROPOFOL N/A 05/21/2020   Procedure: COLONOSCOPY WITH PROPOFOL;  Surgeon: Lanelle Bal, DO;  Location: [facility];  Service: Endoscopy;  Laterality: N/A;  9:30am   ENDOMETRIAL ABLATION     ESOPHAGOGASTRODUODENOSCOPY (EGD) WITH PROPOFOL N/A 09/11/2014   SLF: Normal esophageal mucosa. 2. Small hiatal hernia. 3. Moderate nonerosive erthyema and edema in the antraum. col forceps biopsies taken. 4. Mild erthyema and edema in teh duodenal bulb. Normal second portion of the duodenum. cold fordceps biopsies obtained to evaluate for eosinophilic gastritis. 5. Nausea and vomiting most likely due to reflux, gastrittis, and duodenitis.    POLYPECTOMY  05/21/2020   Procedure: POLYPECTOMY;  Surgeon: Lanelle Bal, DO;  Location: [facility];  Service: Endoscopy;;   TUBAL LIGATION      Prior to Admission medications   Medication Sig Start Date End Date Taking? Authorizing Provider  buPROPion (WELLBUTRIN) 75 MG tablet Take 75 mg by mouth 2 (two) times daily.   Yes  [provider]  diazepam (VALIUM) 5 MG tablet Take 5 mg by mouth 4 (four) times daily.   Yes [provider]  SYMBICORT 160-4.5 MCG/ACT inhaler Inhale 2 puffs into the lungs 2 (two) times daily. 04/02/20  Yes [provider]  acetaminophen (TYLENOL) 325 MG tablet Take 650 mg by mouth every 6 (six) hours as needed for moderate pain.    [provider]  ondansetron (ZOFRAN) 4 MG tablet TAKE 1 TABLET (4 MG TOTAL) BY MOUTH EVERY 8 (EIGHT) HOURS AS NEEDED FOR NAUSEA OR VOMITING. Patient taking differently: Take 4 mg by mouth every 8 (eight) hours as needed for nausea or vomiting. 08/07/16   Anice Paganini, NP    Allergies as of 09/27/2023 - Review Complete 08/30/2023  Allergen Reaction Noted   Bee venom Anaphylaxis 04/01/2017   Amoxicillin Hives 07/30/2014   Aspirin Hives 02/03/2008   Penicillins Swelling 07/30/2014   Betadine [povidone iodine] Rash 09/04/2014    Family History  Problem Relation Age of Onset   Renal cancer Father    Lung cancer Sister        Died age 47   Lung cancer Brother 68   Breast cancer Other        several maternal aunts   Inflammatory bowel disease Other    Heart attack Maternal Grandmother 16   Colon cancer Neg Hx     Social History   Socioeconomic History   Marital status: Divorced    Spouse name: Not on file  Number of children: 2   Years of education: Not on file   Highest education level: Not on file  Occupational History   Occupation: disability  Tobacco Use   Smoking status: Every Day    Current packs/day: 0.50    Average packs/day: 0.5 packs/day for 35.0 years (17.5 ttl pk-yrs)    Types: Cigarettes   Smokeless tobacco: Never  Vaping Use   Vaping status: Never Used  Substance and Sexual Activity   Alcohol use: No   Drug use: No   Sexual activity: Not Currently  Other Topics Concern   Not on file  Social History Narrative   Lives at home with daughter and 3 grandchildren.   Social Drivers of Manufacturing engineer Strain: High Risk (05/24/2023)   Received from [facility]   Overall Financial Resource Strain (CARDIA)    Difficulty of Paying Living Expenses: Very hard  Food Insecurity: No Food Insecurity (05/24/2023)   [facility]   Hunger Vital Sign    Worried About Running Out of Food in the Last Year: Never true    Ran Out of Food in the Last Year: Never true  Transportation Needs: No Transportation Needs (05/24/2023)   Received [facility] - Transportation    Lack of Transportation (Medical): No    Lack of Transportation (Non-Medical): No  Physical Activity: Inactive (05/24/2023)   Received from [facility]   Exercise Vital Sign    Days of Exercise per Week: 0 days    Minutes of Exercise per Session: 30 min  Stress: Stress Concern Present (05/24/2023)   Received from [facility] - Occupational Stress Questionnaire    Feeling of Stress : Rather much  Social Connections: Socially Integrated (05/24/2023)   Received from [facility]   Social Network    How would you rate your social network (family, work, friends)?: Good participation with social networks  Intimate Partner Violence: Not At Risk (05/24/2023)   Received from [facility]   HITS    Over the last 12 months how often did your partner physically hurt you?: Never    Over the last 12 months how often did your partner insult you or talk down to you?: Never    Over the last 12 months how often did your partner threaten you with physical harm?: Never    Over the last 12 months how often did your partner scream or curse at you?: Never    Review of Systems: See HPI, otherwise negative ROS  Physical Exam: Vital signs in last 24 hours:     General:   Alert,  Well-developed, well-nourished, pleasant and cooperative in NAD Head:  Normocephalic and atraumatic. Eyes:  Sclera clear, no icterus.   Conjunctiva pink. Ears:  Normal auditory  acuity. Nose:  No deformity, discharge,  or lesions. Msk:  Symmetrical without gross deformities. Normal posture. Extremities:  Without clubbing or edema. Neurologic:  Alert and  oriented x4;  grossly normal neurologically. Skin:  Intact without significant lesions or rashes. Psych:  Alert and cooperative. Normal mood and affect.  Impression/Plan: Natasha Massey is here for a colonoscopy to be performed for surveillance purposes, personal history of adenomatous colon polyps in 2021  The risks of the procedure including infection, bleed, or perforation as well as benefits, limitations, alternatives and imponderables have been reviewed with the patient. Questions have been answered. All parties agreeable.

## 2023-10-25 NOTE — Anesthesia Preprocedure Evaluation (Signed)
 Anesthesia Evaluation  Patient identified by MRN, date of birth, ID band Patient awake    Reviewed: Allergy & Precautions, H&P , NPO status , Patient's Chart, lab work & pertinent test results, reviewed documented beta blocker date and time   Airway Mallampati: II  TM Distance: >3 FB Neck ROM: full    Dental no notable dental hx.    Pulmonary shortness of breath, pneumonia, COPD, Current Smoker and Patient abstained from smoking.   Pulmonary exam normal breath sounds clear to auscultation       Cardiovascular Exercise Tolerance: Good hypertension,  Rhythm:regular Rate:Normal     Neuro/Psych   Anxiety     negative neurological ROS  negative psych ROS   GI/Hepatic Neg liver ROS,GERD  ,,  Endo/Other  Hypothyroidism    Renal/GU negative Renal ROS  negative genitourinary   Musculoskeletal   Abdominal   Peds  Hematology negative hematology ROS (+)   Anesthesia Other Findings   Reproductive/Obstetrics negative OB ROS                             Anesthesia Physical Anesthesia Plan  ASA: 3  Anesthesia Plan: General   Post-op Pain Management:    Induction:   PONV Risk Score and Plan: Propofol infusion  Airway Management Planned:   Additional Equipment:   Intra-op Plan:   Post-operative Plan:   Informed Consent: I have reviewed the patients History and Physical, chart, labs and discussed the procedure including the risks, benefits and alternatives for the proposed anesthesia with the patient or authorized representative who has indicated his/her understanding and acceptance.     Dental Advisory Given  Plan Discussed with: CRNA  Anesthesia Plan Comments:        Anesthesia Quick Evaluation

## 2023-10-25 NOTE — Transfer of Care (Signed)
 Immediate Anesthesia Transfer of Care Note  Patient: Natasha Massey  Procedure(s) Performed: COLONOSCOPY WITH PROPOFOL POLYPECTOMY  Patient Location: Short Stay  Anesthesia Type:General  Level of Consciousness: drowsy  Airway & Oxygen Therapy: Patient Spontanous Breathing  Post-op Assessment: Report given to RN and Post -op Vital signs reviewed and stable  Post vital signs: Reviewed and stable  Last Vitals:  Vitals Value Taken Time  BP 88/67 10/25/23 0900  Temp    Pulse 81 10/25/23 0900  Resp 25 10/25/23 0900  SpO2 96 % 10/25/23 0900  Vitals shown include unfiled device data.  Last Pain:  Vitals:   10/25/23 0829  PainSc: 0-No pain         Complications: No notable events documented.

## 2023-10-25 NOTE — Discharge Instructions (Addendum)
  Colonoscopy Discharge Instructions  Read the instructions outlined below and refer to this sheet in the next few weeks. These discharge instructions provide you with general information on caring for yourself after you leave the hospital. Your doctor may also give you specific instructions. While your treatment has been planned according to the most current medical practices available, unavoidable complications occasionally occur.   ACTIVITY You may resume your regular activity, but move at a slower pace for the next 24 hours.  Take frequent rest periods for the next 24 hours.  Walking will help get rid of the air and reduce the bloated feeling in your belly (abdomen).  No driving for 24 hours (because of the medicine (anesthesia) used during the test).   Do not sign any important legal documents or operate any machinery for 24 hours (because of the anesthesia used during the test).  NUTRITION Drink plenty of fluids.  You may resume your normal diet as instructed by your doctor.  Begin with a light meal and progress to your normal diet. Heavy or fried foods are harder to digest and may make you feel sick to your stomach (nauseated).  Avoid alcoholic beverages for 24 hours or as instructed.  MEDICATIONS You may resume your normal medications unless your doctor tells you otherwise.  WHAT YOU CAN EXPECT TODAY Some feelings of bloating in the abdomen.  Passage of more gas than usual.  Spotting of blood in your stool or on the toilet paper.  IF YOU HAD POLYPS REMOVED DURING THE COLONOSCOPY: No aspirin products for 7 days or as instructed.  No alcohol for 7 days or as instructed.  Eat a soft diet for the next 24 hours.  FINDING OUT THE RESULTS OF YOUR TEST Not all test results are available during your visit. If your test results are not back during the visit, make an appointment with your caregiver to find out the results. Do not assume everything is normal if you have not heard from your  caregiver or the medical facility. It is important for you to follow up on all of your test results.  SEEK IMMEDIATE MEDICAL ATTENTION IF: You have more than a spotting of blood in your stool.  Your belly is swollen (abdominal distention).  You are nauseated or vomiting.  You have a temperature over 101.  You have abdominal pain or discomfort that is severe or gets worse throughout the day.   Your colonoscopy revealed 13 polyp(s) which I removed successfully. Many of these are likely benign and nothing to worry about.Await pathology results, my office will contact you. Repeat colonoscopy timing to be determined. Otherwise follow up as needed.    I hope you have a great rest of your week!  Hennie Duos. Marletta Lor, D.O. Gastroenterology and Hepatology [facility]

## 2023-10-26 ENCOUNTER — Encounter (HOSPITAL_COMMUNITY): Payer: Self-pay | Admitting: Internal Medicine

## 2023-10-26 LAB — SURGICAL PATHOLOGY

## 2023-10-26 NOTE — Anesthesia Postprocedure Evaluation (Signed)
 Anesthesia Post Note  Patient: Natasha Massey  Procedure(s) Performed: COLONOSCOPY WITH PROPOFOL POLYPECTOMY  Patient location during evaluation: Phase II Anesthesia Type: General Level of consciousness: awake Pain management: pain level controlled Vital Signs Assessment: post-procedure vital signs reviewed and stable Respiratory status: spontaneous breathing and respiratory function stable Cardiovascular status: blood pressure returned to baseline and stable Postop Assessment: no headache and no apparent nausea or vomiting Anesthetic complications: no Comments: Late entry   No notable events documented.   Last Vitals:  Vitals:   10/25/23 0859 10/25/23 0903  BP: (!) 88/67 108/68  Pulse: 80   Resp: 20   Temp: 36.4 C   SpO2: 99%     Last Pain:  Vitals:   10/25/23 0903  TempSrc:   PainSc: 0-No pain                 Windell Norfolk
